# Patient Record
Sex: Female | Born: 1985 | Race: Black or African American | Hispanic: Yes | Marital: Single | Smoking: Current every day smoker
Health system: Southern US, Community
[De-identification: ages and names within clinical notes are randomized; demographics above are authoritative.]

## PROBLEM LIST (undated history)

## (undated) ENCOUNTER — Inpatient Hospital Stay (HOSPITAL_COMMUNITY): Payer: Self-pay

## (undated) DIAGNOSIS — F32A Depression, unspecified: Secondary | ICD-10-CM

## (undated) DIAGNOSIS — F329 Major depressive disorder, single episode, unspecified: Secondary | ICD-10-CM

## (undated) DIAGNOSIS — K219 Gastro-esophageal reflux disease without esophagitis: Secondary | ICD-10-CM

## (undated) DIAGNOSIS — O24419 Gestational diabetes mellitus in pregnancy, unspecified control: Secondary | ICD-10-CM

## (undated) DIAGNOSIS — O2341 Unspecified infection of urinary tract in pregnancy, first trimester: Secondary | ICD-10-CM

## (undated) DIAGNOSIS — E049 Nontoxic goiter, unspecified: Secondary | ICD-10-CM

## (undated) DIAGNOSIS — F319 Bipolar disorder, unspecified: Secondary | ICD-10-CM

## (undated) DIAGNOSIS — IMO0002 Reserved for concepts with insufficient information to code with codable children: Secondary | ICD-10-CM

## (undated) HISTORY — DX: Depression, unspecified: F32.A

## (undated) HISTORY — PX: WISDOM TOOTH EXTRACTION: SHX21

## (undated) HISTORY — DX: Major depressive disorder, single episode, unspecified: F32.9

## (undated) HISTORY — DX: Reserved for concepts with insufficient information to code with codable children: IMO0002

---
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
## 2007-07-08 ENCOUNTER — Emergency Department (HOSPITAL_COMMUNITY): Admission: EM | Admit: 2007-07-08 | Discharge: 2007-07-08 | Payer: Self-pay | Admitting: *Deleted

## 2008-06-22 ENCOUNTER — Emergency Department (HOSPITAL_COMMUNITY): Admission: EM | Admit: 2008-06-22 | Discharge: 2008-06-22 | Payer: Self-pay | Admitting: Emergency Medicine

## 2008-06-24 ENCOUNTER — Inpatient Hospital Stay (HOSPITAL_COMMUNITY): Admission: AD | Admit: 2008-06-24 | Discharge: 2008-06-25 | Payer: Self-pay | Admitting: Obstetrics & Gynecology

## 2008-06-27 ENCOUNTER — Ambulatory Visit: Payer: Self-pay | Admitting: Physician Assistant

## 2008-06-27 ENCOUNTER — Inpatient Hospital Stay (HOSPITAL_COMMUNITY): Admission: AD | Admit: 2008-06-27 | Discharge: 2008-06-27 | Payer: Self-pay | Admitting: Obstetrics & Gynecology

## 2008-07-02 ENCOUNTER — Inpatient Hospital Stay (HOSPITAL_COMMUNITY): Admission: AD | Admit: 2008-07-02 | Discharge: 2008-07-02 | Payer: Self-pay | Admitting: Obstetrics & Gynecology

## 2009-07-08 ENCOUNTER — Inpatient Hospital Stay (HOSPITAL_COMMUNITY): Admission: AD | Admit: 2009-07-08 | Discharge: 2009-07-08 | Payer: Self-pay | Admitting: Family Medicine

## 2009-09-24 IMAGING — US US OB COMP LESS 14 WK
1 series · 13 of 28 positions shown · non-contrast
Comparison: none

CLINICAL DATA: Early pregnancy, vaginal bleeding

OBSTETRIC <14 WK US AND TRANSVAGINAL OB US
TECHNIQUE: Both transabdominal and transvaginal ultrasound
examinations were performed for complete evaluation of the
gestation as well as the maternal uterus, adnexal regions, and
pelvic cul-de-sac.

[Series 1: us ob comp less 14 wks · 13 of 66 slices shown]
[im 3/66]
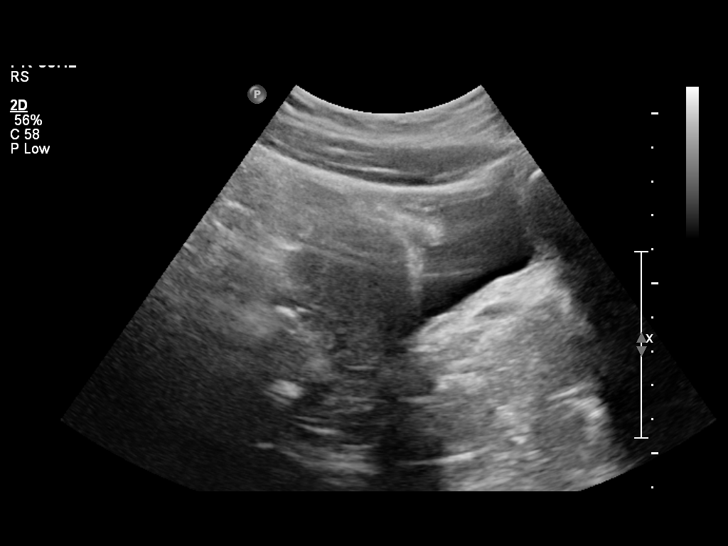
[im 8/66]
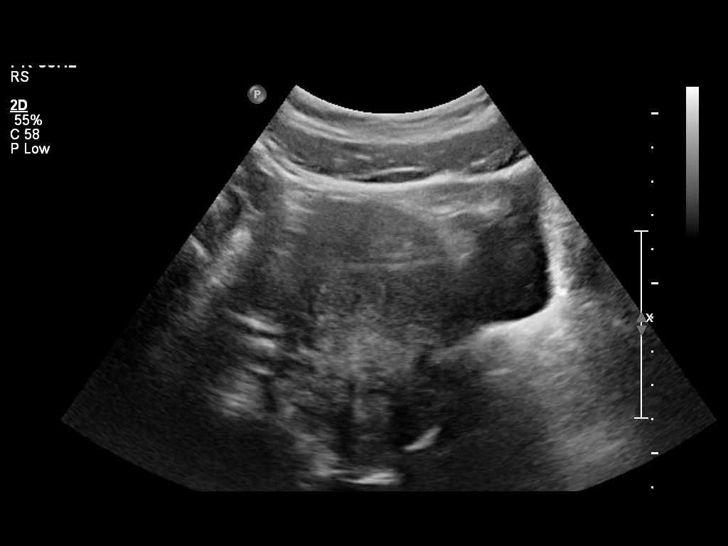
[im 13/66]
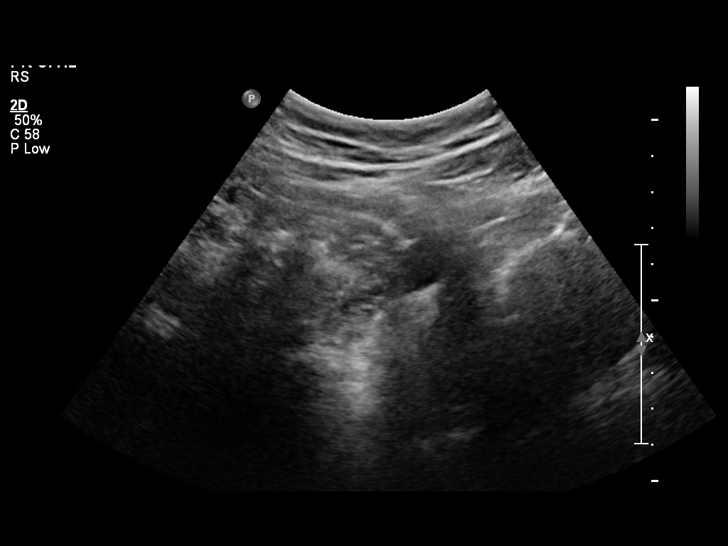
[im 17/66]
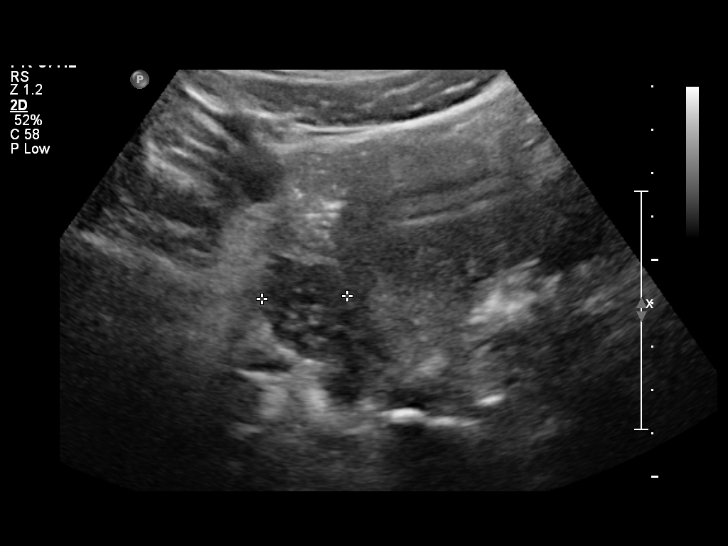
[im 22/66]
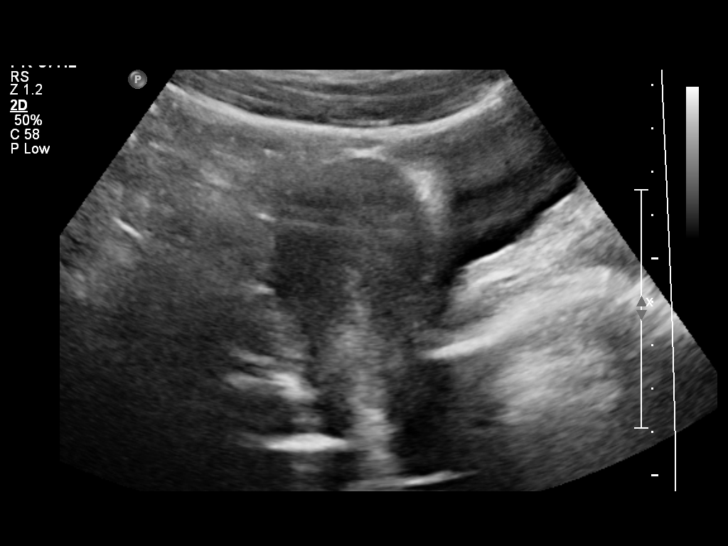
[im 27/66]
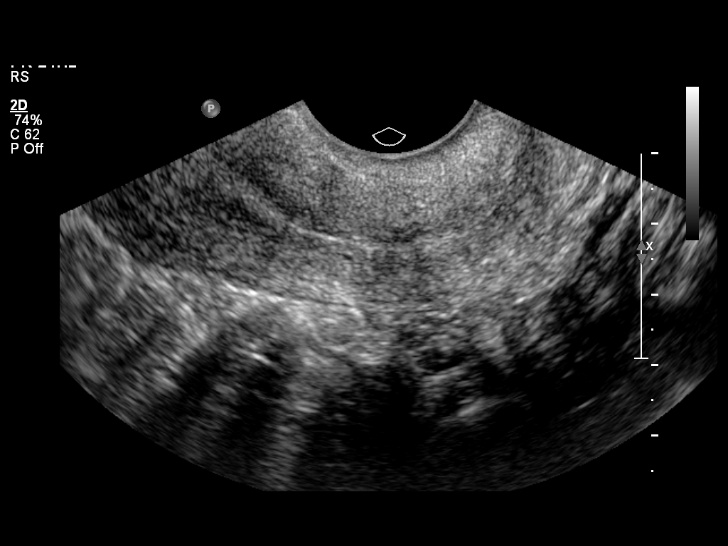
[im 34/66]
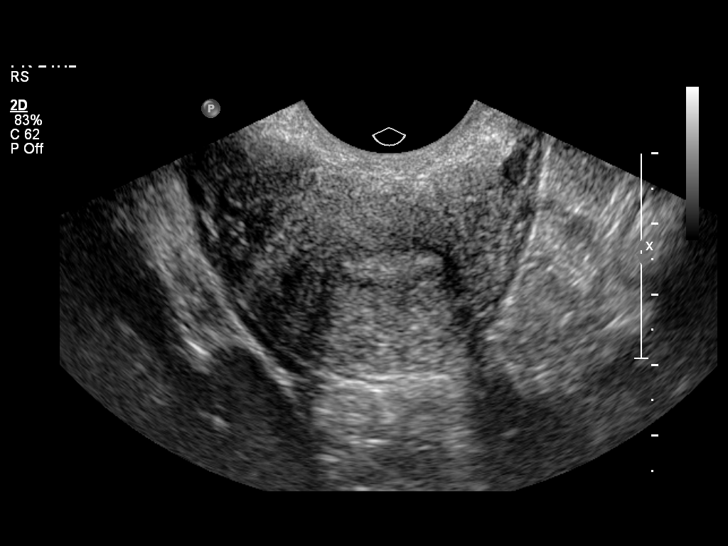
[im 39/66]
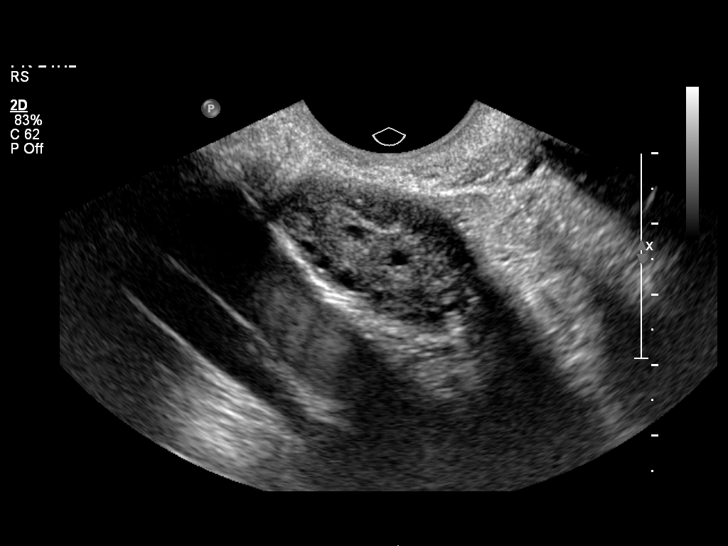
[im 44/66]
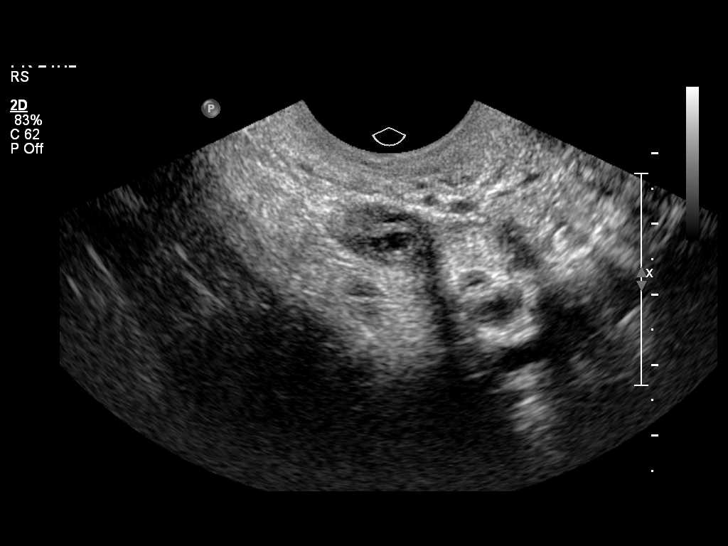
[im 49/66]
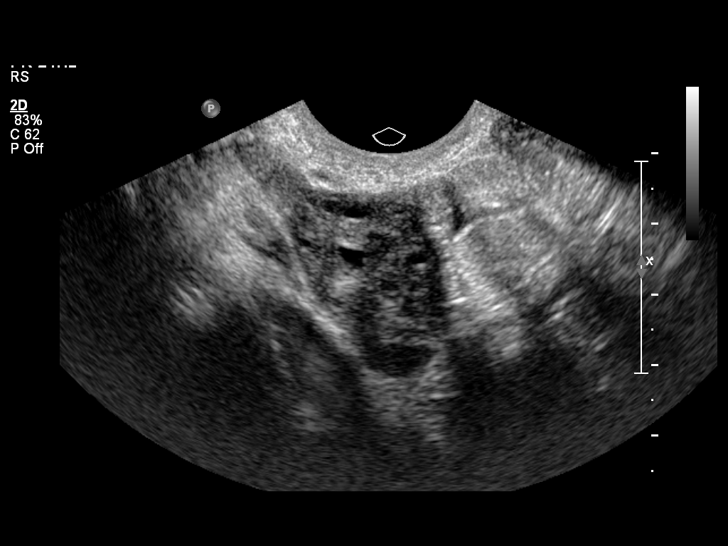
[im 53/66]
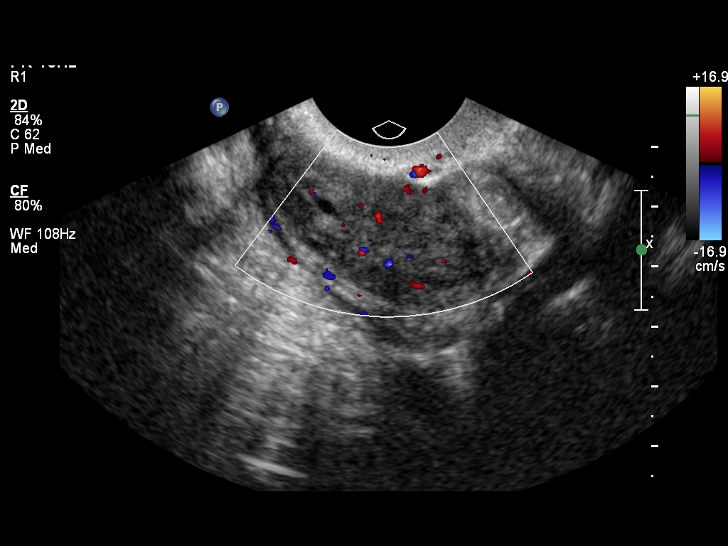
[im 58/66]
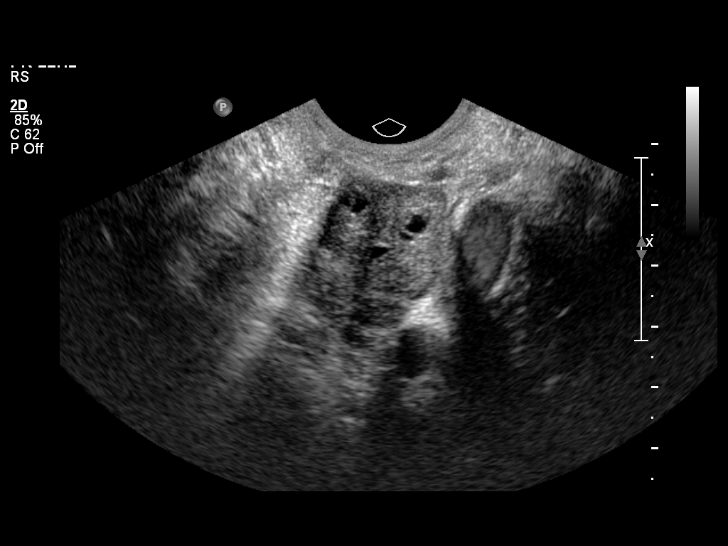
[im 63/66]
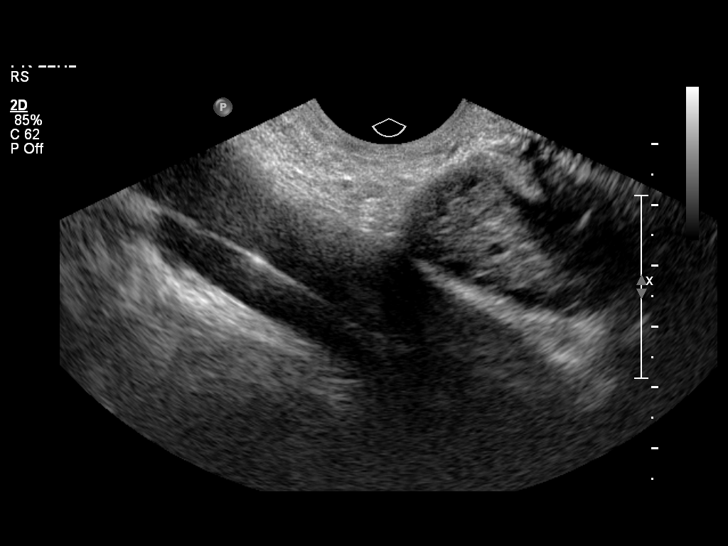

[13 of 28 positions shown; findings below may reference images not displayed]

No prior exams for comparison.
No serum quantitative beta HCG level for correlation

Intrauterine gestational sac: Not identified
Yolk sac: Not identified
Embryo: Not identified
Cardiac Activity: Not identified
Heart Rate: N/A bpm

Maternal uterus/adnexae:
Uterus normal in size morphology.
No gestational sac, intrauterine pregnancy, or endometrial fluid
collection seen.
Few tiny Nabothian cysts in cervix.
Right ovary normal size and morphology, 4.1 x 2.0 x 2.1 cm.
Left ovary normal size and morphology, 4.1 x 2.0 x 2.2 cm.
No ovarian or adnexal mass.
No free pelvic fluid.
IMPRESSION: Normal appearing uterus and ovaries.
No evidence of an intrauterine nor extrauterine pregnancy
identified.
In the absence of visualized intrauterine or extrauterine pregnancy
and lacking a quantitative beta HCG level, differential diagnosis
remains early intrauterine pregnancy too early to visualize,
ectopic pregnancy, and spontaneous abortion.
Correlation with quantitative beta HCG level and either serial
quantitative beta HCG and or follow-up ultrasound recommended to
exclude ectopic pregnancy.

## 2009-12-07 ENCOUNTER — Inpatient Hospital Stay (HOSPITAL_COMMUNITY): Admission: AD | Admit: 2009-12-07 | Discharge: 2009-12-07 | Payer: Self-pay | Admitting: Obstetrics and Gynecology

## 2009-12-31 ENCOUNTER — Ambulatory Visit: Payer: Self-pay | Admitting: Nurse Practitioner

## 2009-12-31 ENCOUNTER — Inpatient Hospital Stay (HOSPITAL_COMMUNITY): Admission: AD | Admit: 2009-12-31 | Discharge: 2009-12-31 | Payer: Self-pay | Admitting: Obstetrics and Gynecology

## 2010-01-23 ENCOUNTER — Inpatient Hospital Stay (HOSPITAL_COMMUNITY): Admission: AD | Admit: 2010-01-23 | Discharge: 2010-01-23 | Payer: Self-pay | Admitting: Obstetrics and Gynecology

## 2010-02-02 ENCOUNTER — Ambulatory Visit: Payer: Self-pay | Admitting: Family

## 2010-02-02 ENCOUNTER — Inpatient Hospital Stay (HOSPITAL_COMMUNITY): Admission: AD | Admit: 2010-02-02 | Discharge: 2010-02-02 | Payer: Self-pay | Admitting: Obstetrics and Gynecology

## 2010-02-03 ENCOUNTER — Inpatient Hospital Stay (HOSPITAL_COMMUNITY): Admission: AD | Admit: 2010-02-03 | Discharge: 2010-02-05 | Payer: Self-pay | Admitting: Obstetrics and Gynecology

## 2010-09-07 LAB — CBC
HCT: 32.8 % — ABNORMAL LOW (ref 36.0–46.0)
HCT: 37.9 % (ref 36.0–46.0)
Hemoglobin: 11.1 g/dL — ABNORMAL LOW (ref 12.0–15.0)
Hemoglobin: 12.7 g/dL (ref 12.0–15.0)
MCH: 32 pg (ref 26.0–34.0)
MCHC: 33.5 g/dL (ref 30.0–36.0)
MCHC: 33.7 g/dL (ref 30.0–36.0)
MCV: 95.3 fL (ref 78.0–100.0)
MCV: 95.8 fL (ref 78.0–100.0)
RBC: 3.98 MIL/uL (ref 3.87–5.11)
RDW: 13.6 % (ref 11.5–15.5)

## 2010-09-07 LAB — RAPID URINE DRUG SCREEN, HOSP PERFORMED
Barbiturates: NOT DETECTED
Opiates: NOT DETECTED

## 2010-09-07 LAB — RPR: RPR Ser Ql: NONREACTIVE

## 2010-09-09 LAB — URINE MICROSCOPIC-ADD ON

## 2010-09-09 LAB — RAPID URINE DRUG SCREEN, HOSP PERFORMED
Amphetamines: NOT DETECTED
Benzodiazepines: NOT DETECTED
Cocaine: NOT DETECTED
Opiates: NOT DETECTED
Tetrahydrocannabinol: POSITIVE — AB

## 2010-09-09 LAB — URINALYSIS, ROUTINE W REFLEX MICROSCOPIC
Bilirubin Urine: NEGATIVE
Bilirubin Urine: NEGATIVE
Bilirubin Urine: NEGATIVE
Glucose, UA: NEGATIVE mg/dL
Glucose, UA: NEGATIVE mg/dL
Hgb urine dipstick: NEGATIVE
Ketones, ur: NEGATIVE mg/dL
Nitrite: NEGATIVE
Protein, ur: 100 mg/dL — AB
Protein, ur: NEGATIVE mg/dL
Specific Gravity, Urine: 1.02 (ref 1.005–1.030)
Urobilinogen, UA: 0.2 mg/dL (ref 0.0–1.0)
Urobilinogen, UA: 0.2 mg/dL (ref 0.0–1.0)
pH: 7 (ref 5.0–8.0)

## 2010-09-09 LAB — URINE CULTURE: Colony Count: >=100000

## 2010-10-08 LAB — CREATININE, SERUM: GFR calc non Af Amer: >60 mL/min (ref >60)

## 2010-10-08 LAB — DIFFERENTIAL
Eosinophils Absolute: 0.2 10*3/uL (ref 0.0–0.7)
Eosinophils Relative: 1 % (ref 0–5)
Lymphs Abs: 3.5 10*3/uL (ref 0.7–4.0)
Monocytes Absolute: 0.8 10*3/uL (ref 0.1–1.0)
Monocytes Relative: 6 % (ref 3–12)

## 2010-10-08 LAB — CBC
HCT: 41.2 % (ref 36.0–46.0)
Hemoglobin: 13.2 g/dL (ref 12.0–15.0)
MCV: 95.2 fL (ref 78.0–100.0)
MCV: 96.3 fL (ref 78.0–100.0)
Platelets: 240 10*3/uL (ref 150–400)
RBC: 4.13 MIL/uL (ref 3.87–5.11)
RDW: 13.6 % (ref 11.5–15.5)
WBC: 15.8 10*3/uL — ABNORMAL HIGH (ref 4.0–10.5)

## 2010-10-08 LAB — WET PREP, GENITAL: Yeast Wet Prep HPF POC: NONE SEEN

## 2010-10-08 LAB — HCG, QUANTITATIVE, PREGNANCY: hCG, Beta Chain, Quant, S: 326 m[IU]/mL — ABNORMAL HIGH (ref <5)

## 2010-10-08 LAB — GC/CHLAMYDIA PROBE AMP, GENITAL: Chlamydia, DNA Probe: NEGATIVE

## 2010-10-08 LAB — BUN: BUN: 4 mg/dL — ABNORMAL LOW (ref 6–23)

## 2011-03-29 LAB — URINALYSIS, ROUTINE W REFLEX MICROSCOPIC
Glucose, UA: NEGATIVE mg/dL
Specific Gravity, Urine: 1.017 (ref 1.005–1.030)

## 2011-03-29 LAB — WET PREP, GENITAL: Yeast Wet Prep HPF POC: NONE SEEN

## 2011-03-29 LAB — URINE CULTURE

## 2011-03-29 LAB — URINE MICROSCOPIC-ADD ON

## 2011-03-29 LAB — GC/CHLAMYDIA PROBE AMP, GENITAL
Chlamydia, DNA Probe: POSITIVE — AB
GC Probe Amp, Genital: NEGATIVE

## 2011-04-10 LAB — BASIC METABOLIC PANEL
CO2: 24
Chloride: 104
Creatinine, Ser: 0.79
GFR calc Af Amer: >60
Potassium: 3.9
Sodium: 135

## 2011-04-10 LAB — CBC
HCT: 41
Hemoglobin: 14
MCHC: 34.2
MCV: 90.2
Platelets: 217
RBC: 4.55
RDW: 12.5
WBC: 9.9

## 2011-04-10 LAB — URINALYSIS, ROUTINE W REFLEX MICROSCOPIC
Bilirubin Urine: NEGATIVE
Glucose, UA: NEGATIVE
Hgb urine dipstick: NEGATIVE
Specific Gravity, Urine: 1.025
Urobilinogen, UA: 1

## 2011-04-10 LAB — COMPREHENSIVE METABOLIC PANEL
AST: 22
BUN: 10
CO2: 23
Calcium: 8.8
Chloride: 103
Creatinine, Ser: 0.81
GFR calc Af Amer: >60
GFR calc non Af Amer: >60
Glucose, Bld: 91
Total Bilirubin: 0.7

## 2011-04-10 LAB — DIFFERENTIAL
Basophils Absolute: 0
Lymphocytes Relative: 10 — ABNORMAL LOW
Lymphs Abs: 1 — ABNORMAL LOW
Neutro Abs: 8.3 — ABNORMAL HIGH
Neutrophils Relative %: 84 — ABNORMAL HIGH

## 2011-04-10 LAB — PREGNANCY, URINE: Preg Test, Ur: NEGATIVE

## 2011-06-25 NOTE — L&D Delivery Note (Signed)
Delivery Note AROM with vtx at 0 to +1, clear fluid.  Her IV came out, so she was unable to get IV antibiotics for GBS, but baby delivered quickly anyway.  At 12:17 AM a viable female was delivered via Vaginal, Spontaneous Delivery (Presentation: Left Occiput Anterior).  APGAR: 8, 9; weight 8 lb 3 oz (3714 g).   Placenta status: Intact, Spontaneous.  Cord: 3 vessels with the following complications: None.  Anesthesia: None  Episiotomy: None Lacerations: None, bilateral labial abrasions not repaired Suture Repair: none Est. Blood Loss (mL): 350  Mom to postpartum.  Baby to stay with mom.  Shawntina Diffee D 02/12/2012, 12:50 AM

## 2011-12-31 ENCOUNTER — Encounter (HOSPITAL_COMMUNITY): Payer: Self-pay

## 2011-12-31 ENCOUNTER — Inpatient Hospital Stay (HOSPITAL_COMMUNITY)
Admission: AD | Admit: 2011-12-31 | Discharge: 2011-12-31 | Disposition: A | Discharge status: Home / Self Care | Payer: Medicaid Other | Source: Ambulatory Visit | Attending: Obstetrics and Gynecology | Admitting: Obstetrics and Gynecology

## 2011-12-31 DIAGNOSIS — R109 Unspecified abdominal pain: Secondary | ICD-10-CM | POA: Insufficient documentation

## 2011-12-31 DIAGNOSIS — O4700 False labor before 37 completed weeks of gestation, unspecified trimester: Secondary | ICD-10-CM | POA: Insufficient documentation

## 2011-12-31 DIAGNOSIS — O479 False labor, unspecified: Secondary | ICD-10-CM

## 2011-12-31 HISTORY — DX: Gastro-esophageal reflux disease without esophagitis: K21.9

## 2011-12-31 HISTORY — DX: Gestational diabetes mellitus in pregnancy, unspecified control: O24.419

## 2011-12-31 HISTORY — DX: Unspecified infection of urinary tract in pregnancy, first trimester: O23.41

## 2011-12-31 HISTORY — DX: Nontoxic goiter, unspecified: E04.9

## 2011-12-31 HISTORY — DX: Bipolar disorder, unspecified: F31.9

## 2011-12-31 LAB — URINALYSIS, ROUTINE W REFLEX MICROSCOPIC
Glucose, UA: NEGATIVE mg/dL
Hgb urine dipstick: NEGATIVE
Leukocytes, UA: NEGATIVE
Protein, ur: NEGATIVE mg/dL
Specific Gravity, Urine: 1.02 (ref 1.005–1.030)
Urobilinogen, UA: 1 mg/dL (ref 0.0–1.0)

## 2011-12-31 LAB — WET PREP, GENITAL
Clue Cells Wet Prep HPF POC: NONE SEEN
Yeast Wet Prep HPF POC: NONE SEEN

## 2011-12-31 LAB — POCT FERN TEST: Fern Test: NEGATIVE

## 2011-12-31 NOTE — MAU Note (Signed)
Pains started yesterday morning.  Lower abd, back and butt.  Every , getting stronger and closer. Mucous d/c noted when went to the bathroom.  Denies hx of PTL- [place]

## 2011-12-31 NOTE — MAU Note (Signed)
Pt says she has had clear, bad smelling, watery, liquid that dampens the crotch of her underware since last night.  Low abd cramping since yesterday.  Last intercourse was two days ago.

## 2011-12-31 NOTE — MAU Provider Note (Signed)
History     CSN: [id number]  Arrival date and time: 12/31/11 1412   First Provider Initiated Contact with Patient 12/31/11 1526      Chief Complaint  Patient presents with  . Labor Eval   HPI Brenda Porter is 26 y.o. G3P1011 [redacted]w[redacted]d weeks presenting with abdominal pain that began yesterday and ? Loosing her mucus plug today.  Denies vaginal bleeding.  States the pain occurs every 10 minutes.  Had vaginal exam 3 weeks ago and told she was closed.  + fetal movement.  States vaginal discharge is white with odor.  Last intercourse 2 days ago. 1 steady partner, not sure he is faithful.     Past Medical History  Diagnosis Date  . Goiter   . Gestational diabetes   . Bipolar 1 disorder, depressed   . GERD (gastroesophageal reflux disease)   . UTI (urinary tract infection) in pregnancy in first trimester     Past Surgical History  Procedure Date  . Wisdom tooth extraction     Family History  Problem Relation Age of Onset  . Hyperthyroidism Mother   . Hypertension Father   . Diabetes Father   . Blindness Father   . Hyperthyroidism Sister   . Cancer Maternal Grandmother   . Hyperthyroidism Maternal Grandmother     History  Substance Use Topics  . Smoking status: Current Everyday Smoker -- 0.5 packs/day for 10 years    Types: Cigarettes  . Smokeless tobacco: Not on file  . Alcohol Use: No    Allergies:  Allergies  Allergen Reactions  . Codeine Nausea And Vomiting, Swelling and Rash  . Latex Itching and Rash  . Oxycodone Swelling and Rash    Hydrocodone OK    Prescriptions prior to admission  Medication Sig Dispense Refill  . calcium carbonate (TUMS - DOSED IN MG ELEMENTAL CALCIUM) 500 MG chewable tablet Chew 2 tablets by mouth 3 (three) times daily as needed. For indigestion        Review of Systems  Gastrointestinal: Positive for abdominal pain. Negative for nausea, vomiting and diarrhea (3 days ago).  Genitourinary:       Neg for vaginal bleeding.  + for  vaginal discharge ? Mucous plug   Physical Exam   Blood pressure 116/60, pulse 87, temperature 98.6 F (37 C), temperature source Oral, resp. rate 20, height 5\' 4"  (1.626 m), weight 100.245 kg (221 lb).  Physical Exam  Constitutional: She is oriented to person, place, and time. She appears well-developed and well-nourished. No distress.  HENT:  Head: Normocephalic.  Neck: Normal range of motion.  Cardiovascular: Normal rate.   Respiratory: Effort normal.  GI: Soft.  Genitourinary: Uterus is enlarged (gravid). Cervix exhibits no discharge. No bleeding around the vagina. Vaginal discharge (small amount of white discharge) found.       Cervix- examined by [facility], RN-closed, posterior  Neurological: She is alert and oriented to person, place, and time.  Skin: Skin is warm and dry.  Psychiatric: She has a normal mood and affect. Her behavior is normal.   Results for orders placed during the hospital encounter of 12/31/11 (from the past 24 hour(s))  URINALYSIS, ROUTINE W REFLEX MICROSCOPIC     Status: Abnormal   Collection Time   12/31/11  2:35 PM      Component Value Range   Color, Urine YELLOW  YELLOW   APPearance CLOUDY (*) CLEAR   Specific Gravity, Urine 1.020  1.005 - 1.030   pH 6.5  5.0 -  8.0   Glucose, UA NEGATIVE  NEGATIVE mg/dL   Hgb urine dipstick NEGATIVE  NEGATIVE   Bilirubin Urine NEGATIVE  NEGATIVE   Ketones, ur NEGATIVE  NEGATIVE mg/dL   Protein, ur NEGATIVE  NEGATIVE mg/dL   Urobilinogen, UA 1.0  0.0 - 1.0 mg/dL   Nitrite NEGATIVE  NEGATIVE   Leukocytes, UA NEGATIVE  NEGATIVE  POCT FERN TEST     Status: Normal   Collection Time   12/31/11  3:09 PM      Component Value Range   Fern Test Negative    WET PREP, GENITAL     Status: Abnormal   Collection Time   12/31/11  4:05 PM      Component Value Range   Yeast Wet Prep HPF POC NONE SEEN  NONE SEEN   Trich, Wet Prep NONE SEEN  NONE SEEN   Clue Cells Wet Prep HPF POC NONE SEEN  NONE SEEN   WBC, Wet Prep HPF POC FEW (*)  NONE SEEN    MAU Course  Procedures  GC/CHL pending NST baseline 140, reactive with 15X15.  Mild irritability at the beginning of the strip that resolved.   MDM 16:44 Called Dr. Senaida Ores to report labs and physical exam findings.  She is in surgery and will call when she is out.   16:50  Patient called out and is ready for discharge.  The patient thinks she is a "little" further along than the office has dated her and she wanted to make sure she wasn't in labor.  I will discharge her to home with instruction to call her doctor for increased pain, ? Leaking of fluid, or decreased fetal movement.  Assessment and Plan  A;  Braxton-Hicks Contractions at [redacted]w[redacted]d      Neg fern, Reactive NST, cervix is closed P:  Keep scheduled appt in the office     Call for worsening sxs  [name],EVE M 12/31/2011, 3:28 PM

## 2012-01-27 ENCOUNTER — Encounter (HOSPITAL_COMMUNITY): Payer: Self-pay

## 2012-01-27 ENCOUNTER — Inpatient Hospital Stay (HOSPITAL_COMMUNITY)
Admission: AD | Admit: 2012-01-27 | Discharge: 2012-01-27 | Disposition: A | Discharge status: Home / Self Care | Payer: Medicaid Other | Source: Ambulatory Visit | Attending: Obstetrics and Gynecology | Admitting: Obstetrics and Gynecology

## 2012-01-27 DIAGNOSIS — O479 False labor, unspecified: Secondary | ICD-10-CM | POA: Insufficient documentation

## 2012-01-27 LAB — URINALYSIS, ROUTINE W REFLEX MICROSCOPIC
Glucose, UA: NEGATIVE mg/dL
Hgb urine dipstick: NEGATIVE
pH: 6.5 (ref 5.0–8.0)

## 2012-01-27 LAB — URINE MICROSCOPIC-ADD ON

## 2012-01-27 NOTE — MAU Note (Signed)
Dr. Ambrose Mantle notified pt in MAU for labor eval, irregualr ctx's, cervix unchanged, membranes intact, gbs negative. Order for pt to walk x1 hour, then recheck cervix and cb with results.

## 2012-01-27 NOTE — MAU Note (Signed)
Pt states ctx's regular since 1500 this afternoon. Denies bleeding or lof. Was 1.5cm in office

## 2012-01-27 NOTE — MAU Note (Signed)
Patient states she is having frequent contractions. Denies any bleeding or leaking and reports fetal movement, just less than usual.

## 2012-02-11 ENCOUNTER — Inpatient Hospital Stay (HOSPITAL_COMMUNITY)
Admission: AD | Admit: 2012-02-11 | Discharge: 2012-02-14 | DRG: 775 | Disposition: A | Discharge status: Home / Self Care | Payer: Medicaid Other | Source: Ambulatory Visit | Attending: Obstetrics and Gynecology | Admitting: Obstetrics and Gynecology

## 2012-02-11 DIAGNOSIS — IMO0002 Reserved for concepts with insufficient information to code with codable children: Secondary | ICD-10-CM | POA: Diagnosis not present

## 2012-02-11 DIAGNOSIS — Z22330 Carrier of Group B streptococcus: Secondary | ICD-10-CM

## 2012-02-11 DIAGNOSIS — O99892 Other specified diseases and conditions complicating childbirth: Secondary | ICD-10-CM | POA: Diagnosis present

## 2012-02-11 DIAGNOSIS — IMO0001 Reserved for inherently not codable concepts without codable children: Secondary | ICD-10-CM

## 2012-02-12 ENCOUNTER — Encounter (HOSPITAL_COMMUNITY): Payer: Self-pay | Admitting: *Deleted

## 2012-02-12 DIAGNOSIS — IMO0001 Reserved for inherently not codable concepts without codable children: Secondary | ICD-10-CM

## 2012-02-12 DIAGNOSIS — O24419 Gestational diabetes mellitus in pregnancy, unspecified control: Secondary | ICD-10-CM

## 2012-02-12 DIAGNOSIS — IMO0002 Reserved for concepts with insufficient information to code with codable children: Secondary | ICD-10-CM

## 2012-02-12 HISTORY — DX: Reserved for concepts with insufficient information to code with codable children: IMO0002

## 2012-02-12 LAB — OB RESULTS CONSOLE GC/CHLAMYDIA
Chlamydia: NEGATIVE
Gonorrhea: NEGATIVE

## 2012-02-12 LAB — OB RESULTS CONSOLE GBS: GBS: POSITIVE

## 2012-02-12 LAB — CBC
Platelets: 152 10*3/uL (ref 150–400)
RBC: 3.75 MIL/uL — ABNORMAL LOW (ref 3.87–5.11)
RDW: 13.7 % (ref 11.5–15.5)
WBC: 17 10*3/uL — ABNORMAL HIGH (ref 4.0–10.5)

## 2012-02-12 LAB — RPR: RPR Ser Ql: NONREACTIVE

## 2012-02-12 MED ORDER — WITCH HAZEL-GLYCERIN EX PADS: 1.0000 "application " | MEDICATED_PAD | CUTANEOUS | Status: DC | PRN

## 2012-02-12 MED ORDER — DIPHENHYDRAMINE HCL 25 MG PO CAPS: 25.0000 mg | ORAL_CAPSULE | Freq: Four times a day (QID) | ORAL | Status: DC | PRN

## 2012-02-12 MED ORDER — OXYTOCIN 10 UNIT/ML IJ SOLN
10.0000 [IU] | Freq: Once | INTRAMUSCULAR | Status: AC
  Administered 2012-02-12: 10 [IU] via INTRAMUSCULAR

## 2012-02-12 MED ORDER — OXYTOCIN 40 UNITS IN LACTATED RINGERS INFUSION - SIMPLE MED: 62.5000 mL/h | Freq: Once | INTRAVENOUS | Status: DC

## 2012-02-12 MED ORDER — METHYLERGONOVINE MALEATE 0.2 MG/ML IJ SOLN: 0.2000 mg | INTRAMUSCULAR | Status: DC | PRN

## 2012-02-12 MED ORDER — IBUPROFEN 600 MG PO TABS
600.0000 mg | ORAL_TABLET | Freq: Four times a day (QID) | ORAL | Status: DC
  Administered 2012-02-12 – 2012-02-14 (×10): 600 mg via ORAL
  Filled 2012-02-12 (×10): qty 1

## 2012-02-12 MED ORDER — CITRIC ACID-SODIUM CITRATE 334-500 MG/5ML PO SOLN: 30.0000 mL | ORAL | Status: DC | PRN

## 2012-02-12 MED ORDER — LACTATED RINGERS IV SOLN: 500.0000 mL | INTRAVENOUS | Status: DC | PRN

## 2012-02-12 MED ORDER — MAGNESIUM HYDROXIDE 400 MG/5ML PO SUSP: 30.0000 mL | ORAL | Status: DC | PRN

## 2012-02-12 MED ORDER — ONDANSETRON HCL 4 MG PO TABS: 4.0000 mg | ORAL_TABLET | ORAL | Status: DC | PRN

## 2012-02-12 MED ORDER — ONDANSETRON HCL 4 MG/2ML IJ SOLN: 4.0000 mg | INTRAMUSCULAR | Status: DC | PRN

## 2012-02-12 MED ORDER — HYDROCODONE-ACETAMINOPHEN 5-325 MG PO TABS
1.0000 | ORAL_TABLET | ORAL | Status: DC | PRN
Start: 2012-02-12 — End: 2012-02-14
  Administered 2012-02-12 – 2012-02-14 (×6): 1 via ORAL
  Filled 2012-02-12 (×6): qty 1

## 2012-02-12 MED ORDER — IBUPROFEN 600 MG PO TABS
600.0000 mg | ORAL_TABLET | Freq: Four times a day (QID) | ORAL | Status: DC | PRN
  Administered 2012-02-12: 600 mg via ORAL
  Filled 2012-02-12: qty 1

## 2012-02-12 MED ORDER — LIDOCAINE HCL (PF) 1 % IJ SOLN: 30.0000 mL | INTRAMUSCULAR | Status: DC | PRN

## 2012-02-12 MED ORDER — ONDANSETRON HCL 4 MG/2ML IJ SOLN: 4.0000 mg | Freq: Four times a day (QID) | INTRAMUSCULAR | Status: DC | PRN

## 2012-02-12 MED ORDER — BENZOCAINE-MENTHOL 20-0.5 % EX AERO
1.0000 "application " | INHALATION_SPRAY | CUTANEOUS | Status: DC | PRN
  Administered 2012-02-13: 1 via TOPICAL
  Filled 2012-02-12: qty 56

## 2012-02-12 MED ORDER — ACETAMINOPHEN 325 MG PO TABS: 650.0000 mg | ORAL_TABLET | ORAL | Status: DC | PRN

## 2012-02-12 MED ORDER — SENNOSIDES-DOCUSATE SODIUM 8.6-50 MG PO TABS
2.0000 | ORAL_TABLET | Freq: Every day | ORAL | Status: DC
  Administered 2012-02-12 – 2012-02-13 (×2): 2 via ORAL

## 2012-02-12 MED ORDER — FLEET ENEMA 7-19 GM/118ML RE ENEM: 1.0000 | ENEMA | RECTAL | Status: DC | PRN

## 2012-02-12 MED ORDER — OXYTOCIN 40 UNITS IN LACTATED RINGERS INFUSION - SIMPLE MED
INTRAVENOUS | Status: AC
  Filled 2012-02-12: qty 1000

## 2012-02-12 MED ORDER — TETANUS-DIPHTH-ACELL PERTUSSIS 5-2.5-18.5 LF-MCG/0.5 IM SUSP: 0.5000 mL | Freq: Once | INTRAMUSCULAR | Status: DC

## 2012-02-12 MED ORDER — OXYTOCIN BOLUS FROM INFUSION
250.0000 mL | Freq: Once | INTRAVENOUS | Status: DC
  Filled 2012-02-12: qty 500

## 2012-02-12 MED ORDER — LANOLIN HYDROUS EX OINT: TOPICAL_OINTMENT | CUTANEOUS | Status: DC | PRN

## 2012-02-12 MED ORDER — PRENATAL MULTIVITAMIN CH
1.0000 | ORAL_TABLET | Freq: Every day | ORAL | Status: DC
  Administered 2012-02-12 – 2012-02-14 (×3): 1 via ORAL
  Filled 2012-02-12 (×3): qty 1

## 2012-02-12 MED ORDER — MEASLES, MUMPS & RUBELLA VAC ~~LOC~~ INJ
0.5000 mL | INJECTION | Freq: Once | SUBCUTANEOUS | Status: DC
  Filled 2012-02-12: qty 0.5

## 2012-02-12 MED ORDER — METHYLERGONOVINE MALEATE 0.2 MG PO TABS: 0.2000 mg | ORAL_TABLET | ORAL | Status: DC | PRN

## 2012-02-12 MED ORDER — SIMETHICONE 80 MG PO CHEW: 80.0000 mg | CHEWABLE_TABLET | ORAL | Status: DC | PRN

## 2012-02-12 MED ORDER — OXYTOCIN 10 UNIT/ML IJ SOLN
INTRAMUSCULAR | Status: AC
  Filled 2012-02-12: qty 2

## 2012-02-12 MED ORDER — LACTATED RINGERS IV SOLN: INTRAVENOUS | Status: DC

## 2012-02-12 MED ORDER — ZOLPIDEM TARTRATE 5 MG PO TABS: 5.0000 mg | ORAL_TABLET | Freq: Every evening | ORAL | Status: DC | PRN

## 2012-02-12 MED ORDER — DIBUCAINE 1 % RE OINT: 1.0000 "application " | TOPICAL_OINTMENT | RECTAL | Status: DC | PRN

## 2012-02-12 NOTE — Progress Notes (Signed)
UR chart review completed.  

## 2012-02-12 NOTE — Progress Notes (Signed)
Patient ID: Brenda Porter, female   DOB: [birth year], 26 y.o.   MRN: [id number] Pt c/o some right thigh weakness and cramping with earlier ambulation Had no epidural and fast delivery D/w her prob muscle spasm and advised to walk with help this pm to decrease any risk of falls

## 2012-02-12 NOTE — Progress Notes (Signed)
PPD #0 No problems Afeb, VSS Fundus firm, NT at U-1 Continue routine postpartum care 

## 2012-02-12 NOTE — Progress Notes (Signed)
Here via EMS, SVE done, Rapid response OB here, will transport patient to room 167 stat.

## 2012-02-12 NOTE — H&P (Signed)
Brenda Porter is a 26 y.o. female, G3 P1011, EGA 39+ weeks with EDC 8-24 by 24 week u/s presenting for reg ctx.  Pts mom called and said pt started leaking fluid at 1900, then had reg ctx which became severe.  She got EMS to bring pt to hospital.  In MAU, VE complete, taken to L&D.  She had an IV, but pulled it out when on L&D.  Prenatal care complicated by late care at about 24 weeks, sporadic care, but not very compliant with visits.  GBS+ in urine, treated with PO Keflex.  See prenatal records for complete history.  Maternal Medical History:  Reason for admission: Reason for admission: contractions.  Contractions: Frequency: regular.   Perceived severity is strong.    Fetal activity: Perceived fetal activity is normal.    Prenatal complications: Late ans limited care    OB History    Grav Para Term Preterm Abortions TAB SAB Ect Mult Living   3 1 1  1   1  1     SVD at 39 weeks, 6 lbs 8 oz, no comps Right ectopic treated with MTX  Past Medical History  Diagnosis Date  . Goiter   . Gestational diabetes   . Bipolar 1 disorder, depressed   . GERD (gastroesophageal reflux disease)   . UTI (urinary tract infection) in pregnancy in first trimester    Past Surgical History  Procedure Date  . Wisdom tooth extraction    Family History: family history includes Blindness in her father; Cancer in her maternal grandmother; Diabetes in her father; Hypertension in her father; and Hyperthyroidism in her maternal grandmother, mother, and sister.  There is no history of Other. Social History:  reports that she has been smoking Cigarettes.  She has a 5 pack-year smoking history. She has never used smokeless tobacco. She reports that she does not drink alcohol or use illicit drugs.   Prenatal Transfer Tool  Maternal Diabetes: No Genetic Screening: Declined, too late Maternal Ultrasounds/Referrals: Normal Fetal Ultrasounds or other Referrals:  None Maternal Substance Abuse:  Yes:  Type:  Smoker Significant Maternal Medications:  None Significant Maternal Lab Results:  Lab values include: Group B Strep positive Other Comments:  unable to get abx for GBS  Review of Systems  Respiratory: Negative.   Cardiovascular: Negative.     Dilation:  (NVD) Station: 0 Exam by:: Fermina Mishkin Blood pressure 155/85, pulse 68, temperature 98.4 F (36.9 C), temperature source Oral, resp. rate 18. Maternal Exam:  Uterine Assessment: Contraction strength is firm.  Contraction frequency is regular.   Abdomen: Patient reports no abdominal tenderness. Estimated fetal weight is 8 lbs.   Fetal presentation: vertex  Introitus: Normal vulva. Normal vagina.  Amniotic fluid character: clear.  Pelvis: adequate for delivery.   Cervix: Cervix evaluated by digital exam.     Fetal Exam Fetal Monitor Review: Mode: ultrasound.   Variability: moderate (6-25 bpm).    Fetal State Assessment: Category I - tracings are normal.     Physical Exam  Constitutional: She appears well-developed and well-nourished.  Cardiovascular: Normal rate, regular rhythm and normal heart sounds.   No murmur heard. Respiratory: Effort normal and breath sounds normal. No respiratory distress. She has no wheezes.  GI: Soft.       gravid    Prenatal labs: ABO, Rh:  A pos Antibody:  neg Rubella:  Imm RPR:   NR HBsAg:   Neg HIV:   NR GBS:  positive GCT:  140 GTT:  normal  Assessment/Plan: IUP at 39 weeks admitted in active labor completely dilated, did not get abx for +GBS.  See delivery note.   Devonn Giampietro D 02/12/2012, 12:41 AM

## 2012-02-12 NOTE — Progress Notes (Signed)
IV out due to increase maternal movement, IV d/c'd intact with dressing CHayes, RN

## 2012-02-13 NOTE — Progress Notes (Addendum)
PPD #1 No problems, leg is better today Afeb, VSS Fundus firm, NT at U-1 Continue routine postpartum care, keep here today since baby did not get abx for GBS.   I neglected to mention in her delivery note that she had a mild shoulder dystocia that resolved with McRoberts maneuver and suprapubic pressure.  I could not addend the delivery note.

## 2012-02-13 NOTE — Clinical Social Work Maternal (Signed)
    LATE ENTRY FROM 02/12/12:  Clinical Social Work Department PSYCHOSOCIAL ASSESSMENT - MATERNAL/CHILD 02/13/2012  Patient:  SHELETHA, BOW  Account Number:  [id number]  Admit Date:  02/11/2012  Marjo Bicker Name:   Ines Bloomer    Clinical Social Worker:  Andy Gauss   Date/Time:  02/12/2012 12:00 N  Date Referred:  02/12/2012   Referral source  CN     Referred reason  Behavioral Health Issues  Substance Abuse   Other referral source:    I:  FAMILY / HOME ENVIRONMENT Child's legal guardian:  PARENT  Guardian - Name Guardian - Age Guardian - Address  Hannahgrace Lalli [address]; [place], [place] [address]  Armanda Magic 29 (same as above)   Other household support members/support persons Name Relationship DOB  Mya Jimesha Rising 02/03/10   RELATIVE    Other support:   FOB's mom, Terance Hart    II  PSYCHOSOCIAL DATA Information Source:  Patient Interview  Financial and Community Resources Employment:   Financial resources:  [employer] If Medicaid - County:  [employer] Other  Food Stamps  WIC   School / Grade:   Maternity Care Coordinator / Child Services Coordination / Early Interventions:  Cultural issues impacting care:    III  STRENGTHS Strengths  Adequate Resources  Home prepared for Child (including basic supplies)  Supportive family/friends   Strength comment:    IV  RISK FACTORS AND CURRENT PROBLEMS Current Problem:  YES   Risk Factor & Current Problem Patient Issue Family Issue Risk Factor / Current Problem Comment  Mental Illness Y N Hx of anxiety & bipolar disorder  Substance Abuse Y N Hx of MJ/Cocaine use   N N     V  SOCIAL WORK ASSESSMENT Sw referral received to assess history of mental illnesses and substance use.  Pt told Sw that she was diagnosed with anxiety, depression & bipolar in March 2013 by staff at [facility].  Pt states she was encouraged by a friend to seek mental health treatment after expressing depressed  feelings.  Pt symptoms were not treated with medication however she did attend therapy once or twice a month during pregnancy.  Pt attributes the source of her depression to FOB's incarceration at that time.  He is no longer in prison.  She thinks therapy is helpful and plans to continue treatment upon discharge.  She denies any history of SI/HI.  She denies any depression at the present time, as she reports feeling happy.  Pt continues to experience social anxiety however coping well.  She denies any illegal substance use in over 1 year.  She has all the necessary supplies for the infant and good family support.  Sw will continue to follow and assist further if needed.      VI SOCIAL WORK PLAN Social Work Plan  No Further Intervention Required / No Barriers to Discharge   Type of pt/family education:   If child protective services report - county:   If child protective services report - date:   Information/referral to community resources comment:   Other social work plan:

## 2012-02-14 MED ORDER — HYDROCODONE-ACETAMINOPHEN 5-325 MG PO TABS: 1.0000 | ORAL_TABLET | ORAL | Status: AC | PRN

## 2012-02-14 MED ORDER — IBUPROFEN 600 MG PO TABS: 600.0000 mg | ORAL_TABLET | Freq: Four times a day (QID) | ORAL | Status: AC

## 2012-02-14 NOTE — Progress Notes (Signed)
PPD #2 Cramping Afeb, VSS Fundus firm D/c home

## 2012-02-14 NOTE — Discharge Summary (Signed)
Obstetric Discharge Summary Reason for Admission: onset of labor Prenatal Procedures: none Intrapartum Procedures: spontaneous vaginal delivery with mild shoulder dystocia Postpartum Procedures: none Complications-Operative and Postpartum: none Hemoglobin  Date Value Range Status  02/12/2012 11.9* 12.0 - 15.0 g/dL Final     HCT  Date Value Range Status  02/12/2012 34.6* 36.0 - 46.0 % Final    Physical Exam:  General: alert Lochia: appropriate Uterine Fundus: firm  Discharge Diagnoses: Term Pregnancy-delivered and mild shoulder dystocia  Discharge Information: Date: 02/14/2012 Activity: pelvic rest Diet: routine Medications: Ibuprofen and Vicodin Condition: stable Instructions: refer to practice specific booklet Discharge to: home Follow-up Information    Follow up with Trenesha Alcaide D, MD. Schedule an appointment as soon as possible for a visit in 6 weeks.   Contact information:   [address] [place] [address] [phone]          Newborn Data: Live born female  Birth Weight: 8 lb 3 oz (3714 g) APGAR: 8, 9  Home with mother.  Lind Ausley D 02/14/2012, 8:08 AM

## 2014-04-25 ENCOUNTER — Encounter (HOSPITAL_COMMUNITY): Payer: Self-pay | Admitting: *Deleted

## 2016-08-19 ENCOUNTER — Encounter (HOSPITAL_COMMUNITY): Payer: Self-pay | Admitting: *Deleted

## 2016-08-19 ENCOUNTER — Emergency Department (HOSPITAL_COMMUNITY)
Admission: EM | Admit: 2016-08-19 | Discharge: 2016-08-20 | Disposition: A | Discharge status: Home / Self Care | Payer: Medicaid Other | Attending: Emergency Medicine | Admitting: Emergency Medicine

## 2016-08-19 DIAGNOSIS — F17210 Nicotine dependence, cigarettes, uncomplicated: Secondary | ICD-10-CM | POA: Insufficient documentation

## 2016-08-19 DIAGNOSIS — Z202 Contact with and (suspected) exposure to infections with a predominantly sexual mode of transmission: Secondary | ICD-10-CM | POA: Insufficient documentation

## 2016-08-19 DIAGNOSIS — Z91040 Latex allergy status: Secondary | ICD-10-CM | POA: Insufficient documentation

## 2016-08-19 LAB — WET PREP, GENITAL
Sperm: NONE SEEN
TRICH WET PREP: NONE SEEN
YEAST WET PREP: NONE SEEN

## 2016-08-19 LAB — I-STAT BETA HCG BLOOD, ED (MC, WL, AP ONLY): I-stat hCG, quantitative: <5 m[IU]/mL (ref <5)

## 2016-08-19 NOTE — ED Triage Notes (Signed)
Pt states that he was recently informed that her partner had herpes. Pt requesting testing for STD.

## 2016-08-19 NOTE — ED Provider Notes (Signed)
[facility]-EMERGENCY DEPT Provider Note   CSN: [id number] Arrival date & time: 08/19/16  1748  By signing my name below, I, Brenda Porter, attest that this documentation has been prepared under the direction and in the presence of non-physician practitioner, Kerrie Buffalo, NP. Electronically Signed: Modena Porter, Scribe. 08/19/2016. 10:33 PM.  History   Chief Complaint Chief Complaint  Patient presents with  . Exposure to STD   The history is provided by the patient. No language interpreter was used.   HPI Comments: Brenda Porter is a 31 y.o. female who presents to the Emergency Department complaining of potential STD exposure. She states she recently found out her partner has herpes and wants testing. She admits to intercourse with partner within the last 48 hours. No associated symptoms. She denies any other complaints. Patient states she wants tested for everything.  Past Medical History:  Diagnosis Date  . Bipolar 1 disorder, depressed (HCC)   . GERD (gastroesophageal reflux disease)   . Gestational diabetes   . Goiter   . UTI (urinary tract infection) in pregnancy in first trimester     Patient Active Problem List   Diagnosis Date Noted  . Active labor 02/12/2012  . SVD (spontaneous vaginal delivery) 02/12/2012  . Shoulder dystocia 02/12/2012    Past Surgical History:  Procedure Laterality Date  . WISDOM TOOTH EXTRACTION      OB History    Gravida Para Term Preterm AB Living   3 2 2   1 2    SAB TAB Ectopic Multiple Live Births       1   1       Home Medications    Prior to Admission medications   Medication Sig Start Date End Date Taking? Authorizing Provider  calcium carbonate (TUMS - DOSED IN MG ELEMENTAL CALCIUM) 500 MG chewable tablet Chew 2 tablets by mouth 3 (three) times daily as needed. For indigestion    Historical Provider, MD    Family History Family History  Problem Relation Age of Onset  . Hyperthyroidism Mother   . Hypertension Father   .  Diabetes Father   . Blindness Father   . Hyperthyroidism Sister   . Cancer Maternal Grandmother   . Hyperthyroidism Maternal Grandmother   . Other Neg Hx     Social History Social History  Substance Use Topics  . Smoking status: Current Every Day Smoker    Packs/day: 0.50    Years: 10.00    Types: Cigarettes  . Smokeless tobacco: Never Used  . Alcohol use No     Allergies   Codeine; Latex; and Oxycodone   Review of Systems Review of Systems  Constitutional: Negative for fever.  HENT: Negative.   Gastrointestinal: Negative for abdominal pain, nausea and vomiting.  Genitourinary: Negative for pelvic pain, vaginal bleeding and vaginal discharge.  Skin: Negative for rash.  Neurological: Negative for headaches.     Physical Exam Updated Vital Signs BP 123/70 (BP Location: Right Arm)   Pulse 60   Temp 98.1 F (36.7 C) (Oral)   Resp 18   LMP 07/02/2016   SpO2 99%   Physical Exam  Constitutional: She appears well-developed and well-nourished. No distress.  HENT:  Head: Normocephalic.  Eyes: Conjunctivae are normal.  Neck: Neck supple.  Cardiovascular: Normal rate and regular rhythm.   Pulmonary/Chest: Effort normal.  Abdominal: Soft.  Genitourinary:  Genitourinary Comments: External genitalia without lesions, white d/c vaginal vault, no CMT, no adnexal tenderness. Uterus without palpable enlargement.   Musculoskeletal:  Normal range of motion.  Neurological: She is alert.  Skin: Skin is warm and dry.  Psychiatric: She has a normal mood and affect.  Nursing note and vitals reviewed.    ED Treatments / Results  DIAGNOSTIC STUDIES: Oxygen Saturation is 99% on RA, normal by my interpretation.    COORDINATION OF CARE: 10:37 PM- Pt advised of plan for treatment and pt agrees.  Labs (all labs ordered are listed, but only abnormal results are displayed) Labs Reviewed  WET PREP, GENITAL - Abnormal; Notable for the following:       Result Value   Clue Cells  Wet Prep HPF POC PRESENT (*)    WBC, Wet Prep HPF POC MANY (*)    All other components within normal limits  HIV ANTIBODY (ROUTINE TESTING)  RPR  I-STAT BETA HCG BLOOD, ED ([facility], [facility], [facility] ONLY)  GC/CHLAMYDIA PROBE AMP () NOT [facility]   Radiology No results found.  Procedures Procedures (including critical care time)  Medications Ordered in ED Medications - No data to display   Initial Impression / Assessment and Plan / ED Course  I have reviewed the triage vital signs and the nursing notes.Pt arrives for asymptomatic STD check. Initial testing is negative for trichomonas. Discussed safe sexual practices. Discussed with the patient that there are no lesions at this time to test for HSV. Discussed signs and symptoms. Pt is advised to follow up for free testing at local health department in the future. Pt appears safe for discharge. Cultures pending  Final Clinical Impressions(s) / ED Diagnoses   Final diagnoses:  STD exposure    New Prescriptions Discharge Medication List as of 08/20/2016 12:19 AM     I personally performed the services described in this documentation, which was scribed in my presence. The recorded information has been reviewed and is accurate.     [facility] MorichesHope M Mackenzy Grumbine, [place]NP 08/20/16 1610    Gwyneth SproutWhitney Plunkett, MD 08/20/16 [phone]

## 2016-08-20 LAB — GC/CHLAMYDIA PROBE AMP (~~LOC~~) NOT AT ARMC
CHLAMYDIA, DNA PROBE: NEGATIVE
Neisseria Gonorrhea: POSITIVE — AB

## 2016-08-20 LAB — RPR: RPR: NONREACTIVE

## 2016-08-20 LAB — HIV ANTIBODY (ROUTINE TESTING W REFLEX): HIV Screen 4th Generation wRfx: NONREACTIVE

## 2016-08-20 NOTE — Discharge Instructions (Signed)
You do not have any lesions tonight to test for herpes. I am giving you information about herpes so you will know the signs and symptoms to look for. If any of your cultures come back positive someone will call you. Follow up with the health department for further testing or exposures.

## 2016-08-20 NOTE — ED Notes (Signed)
Pt verbalized understanding of d/c instructions and has no further questions. Pt is stable, A&Ox4, VSS.  

## 2016-09-26 ENCOUNTER — Encounter: Payer: Self-pay | Admitting: Emergency Medicine

## 2016-09-26 ENCOUNTER — Emergency Department (HOSPITAL_COMMUNITY)
Admission: EM | Admit: 2016-09-26 | Discharge: 2016-09-26 | Disposition: A | Discharge status: Home / Self Care | Payer: Medicaid Other | Attending: Emergency Medicine | Admitting: Emergency Medicine

## 2016-09-26 DIAGNOSIS — F17210 Nicotine dependence, cigarettes, uncomplicated: Secondary | ICD-10-CM | POA: Insufficient documentation

## 2016-09-26 DIAGNOSIS — Z79899 Other long term (current) drug therapy: Secondary | ICD-10-CM | POA: Insufficient documentation

## 2016-09-26 DIAGNOSIS — Z202 Contact with and (suspected) exposure to infections with a predominantly sexual mode of transmission: Secondary | ICD-10-CM | POA: Insufficient documentation

## 2016-09-26 DIAGNOSIS — Z91040 Latex allergy status: Secondary | ICD-10-CM | POA: Insufficient documentation

## 2016-09-26 DIAGNOSIS — N760 Acute vaginitis: Secondary | ICD-10-CM | POA: Insufficient documentation

## 2016-09-26 DIAGNOSIS — M25511 Pain in right shoulder: Secondary | ICD-10-CM | POA: Insufficient documentation

## 2016-09-26 LAB — WET PREP, GENITAL
SPERM: NONE SEEN
TRICH WET PREP: NONE SEEN
YEAST WET PREP: NONE SEEN

## 2016-09-26 LAB — POC URINE PREG, ED: Preg Test, Ur: NEGATIVE

## 2016-09-26 MED ORDER — CEFTRIAXONE SODIUM 250 MG IJ SOLR
250.0000 mg | Freq: Once | INTRAMUSCULAR | Status: AC
  Administered 2016-09-26: 250 mg via INTRAMUSCULAR
  Filled 2016-09-26: qty 250

## 2016-09-26 MED ORDER — AZITHROMYCIN 250 MG PO TABS
1000.0000 mg | ORAL_TABLET | Freq: Once | ORAL | Status: AC
  Administered 2016-09-26: 1000 mg via ORAL
  Filled 2016-09-26: qty 4

## 2016-09-26 MED ORDER — METHOCARBAMOL 500 MG PO TABS: 500.0000 mg | ORAL_TABLET | Freq: Four times a day (QID) | ORAL | 0 refills | Status: DC | PRN

## 2016-09-26 MED ORDER — LIDOCAINE HCL 1 % IJ SOLN
INTRAMUSCULAR | Status: AC
  Administered 2016-09-26: 0.9 mL
  Filled 2016-09-26: qty 20

## 2016-09-26 NOTE — ED Triage Notes (Signed)
Pt states that she has had atraumatic R shoulder pain x 3 days. Pt also states she is having lower abd pain similar to when she had gonnorhea recently. States she and her partner were both treated but they are still having symptoms. Pt also missed her period in March. Alert and oriented.

## 2016-09-26 NOTE — ED Notes (Signed)
Bed: [facility] Expected date:  Expected time:  Means of arrival:  Comments: 

## 2016-09-26 NOTE — Discharge Instructions (Signed)
Begin taking Robaxin as needed for muscle spasms. Follow up with PCP for further evaluation. Return to ED for worsening symptoms, pelvic pain, bleeding or fever.

## 2016-09-26 NOTE — ED Provider Notes (Signed)
[facility]-EMERGENCY DEPT Provider Note   CSN: [id number] Arrival date & time: 09/26/16  1815     History   Chief Complaint Chief Complaint  Patient presents with  . Vaginal Discharge  . Shoulder Pain    HPI Brenda Porter is a 31 y.o. female.  Patient presents with 2 day history of R shoulder pain that radiates down arm and associated with tingling feeling. Denies any trauma or prior surgeries. States she is still able to have full ROM and sensation. States history of similar episode in past which improved with a few days of muscle relaxer. Patient is also c/o vaginal discharge that has not improved since her last visit approx 6 weeks ago. Describes it as "white and clumpy." States she received her tx for GC and chlamydia, but is unsure if partner did. She also reports suprapubic pain radiating to her back. No vaginal bleeding or dysuria. States she missed her period last month. Is sexually active with just that 1 partner and does not use protection and is not on OCP. Denies chest pain, SOB, leg swelling, headache, vision changes, diarrhea, vomiting or hematuria.      Past Medical History:  Diagnosis Date  . Bipolar 1 disorder, depressed (HCC)   . GERD (gastroesophageal reflux disease)   . Gestational diabetes   . Goiter   . UTI (urinary tract infection) in pregnancy in first trimester     Patient Active Problem List   Diagnosis Date Noted  . Active labor 02/12/2012  . SVD (spontaneous vaginal delivery) 02/12/2012  . Shoulder dystocia 02/12/2012    Past Surgical History:  Procedure Laterality Date  . WISDOM TOOTH EXTRACTION      OB History    Gravida Para Term Preterm AB Living   SAB TAB Ectopic Multiple Live Births       1   1       Home Medications    Prior to Admission medications   Medication Sig Start Date End Date Taking? Authorizing Provider  acetaminophen (TYLENOL) 325 MG tablet Take 650 mg by mouth every 6 (six) hours as needed (pain).    Yes Historical Provider, MD  methocarbamol (ROBAXIN) 500 MG tablet Take 1 tablet (500 mg total) by mouth every 6 (six) hours as needed for muscle spasms. 09/26/16   Dietrich Pates, PA-C    Family History Family History  Problem Relation Age of Onset  . Hyperthyroidism Mother   . Hypertension Father   . Diabetes Father   . Blindness Father   . Hyperthyroidism Sister   . Cancer Maternal Grandmother   . Hyperthyroidism Maternal Grandmother   . Other Neg Hx     Social History Social History  Substance Use Topics  . Smoking status: Current Every Day Smoker    Packs/day: 0.50    Years: 10.00    Types: Cigarettes  . Smokeless tobacco: Never Used  . Alcohol use No     Allergies   Codeine; Latex; and Oxycodone   Review of Systems Review of Systems  Constitutional: Negative for appetite change, chills and fever.  HENT: Negative for sneezing and sore throat.   Eyes: Negative for visual disturbance.  Respiratory: Negative for cough, chest tightness, shortness of breath and wheezing.   Cardiovascular: Negative for chest pain and palpitations.  Gastrointestinal: Negative for abdominal pain, blood in stool, constipation, diarrhea, nausea and vomiting.  Genitourinary: Positive for vaginal discharge and vaginal pain. Negative for dysuria, hematuria  and urgency.  Musculoskeletal: Positive for arthralgias, back pain and myalgias.  Skin: Negative for rash.  Neurological: Negative for dizziness, weakness and light-headedness.     Physical Exam Updated Vital Signs BP 117/64 (BP Location: Left Arm)   Pulse 65   Temp 98.7 F (37.1 C) (Oral)   Resp 18   LMP 07/29/2016 (Approximate)   SpO2 98%   Physical Exam  Constitutional: She is oriented to person, place, and time. She appears well-developed and well-nourished. No distress.  HENT:  Head: Normocephalic and atraumatic.  Nose: Nose normal.  Eyes: Conjunctivae and EOM are normal. Right eye exhibits no discharge. Left eye exhibits no  discharge. No scleral icterus.  Neck: Normal range of motion. Neck supple.  Cardiovascular: Normal rate, regular rhythm, normal heart sounds and intact distal pulses.  Exam reveals no gallop and no friction rub.   No murmur heard. Pulmonary/Chest: Effort normal and breath sounds normal. No respiratory distress.  Abdominal: Soft. Bowel sounds are normal. She exhibits no distension. There is tenderness (Suprapubic). There is no guarding.  Genitourinary: There is no rash or lesion on the right labia. There is no rash or lesion on the left labia. Cervix exhibits discharge. Cervix exhibits no motion tenderness and no friability. Right adnexum displays no tenderness. Left adnexum displays no tenderness. Vaginal discharge found.  Genitourinary Comments: Thin white discharge noted around cervix. No friability. Patient reported suprapubic tenderness with cervical motion exam. No adnexal tenderness.  Musculoskeletal: Normal range of motion. She exhibits tenderness (TTP of R shoulder area and trapezius). She exhibits no edema.  Neurological: She is alert and oriented to person, place, and time. No sensory deficit. She exhibits normal muscle tone. Coordination normal.  Skin: Skin is warm and dry. No rash noted.  Psychiatric: She has a normal mood and affect.  Nursing note and vitals reviewed.    ED Treatments / Results  Labs (all labs ordered are listed, but only abnormal results are displayed) Labs Reviewed  WET PREP, GENITAL - Abnormal; Notable for the following:       Result Value   Clue Cells Wet Prep HPF POC PRESENT (*)    WBC, Wet Prep HPF POC FEW (*)    All other components within normal limits  POC URINE PREG, ED  GC/CHLAMYDIA PROBE AMP ([place]) NOT AT [facility]  WET PREP  (BD AFFIRM) ()    EKG  EKG Interpretation None       Radiology No results found.  Procedures Procedures (including critical care time)  Medications Ordered in ED Medications  cefTRIAXone  (ROCEPHIN) injection 250 mg (not administered)  azithromycin (ZITHROMAX) tablet 1,000 mg (not administered)     Initial Impression / Assessment and Plan / ED Course  I have reviewed the triage vital signs and the nursing notes.  Pertinent labs & imaging results that were available during my care of the patient were reviewed by me and considered in my medical decision making (see chart for details).     Patient's history and symptoms concerning for vaginitis vs. STD. Arm pain could be due to muscle spasm. Low likelihood of muscle strain or fracture due to no history of injury or trauma to the area. Considered disseminated GC/chlamydia infection; however, there is more tenderness along muscular area and no evidence of red, hot or infected joint and patient has full ROM and afebrile. Pelvic exam revealed thin white discharge and slight CMT. Wet prep showed clue cells present and few WBC. Regardless, suspicion for GC/chlamydia remains  high. We will treat empirically due to history and discharge present.  Patient was urged to inform her partner and make sure they are treated as well, as this is the only way her symptoms will resolve. Will give Robaxin for shoulder pain. Return precautions given.  Final Clinical Impressions(s) / ED Diagnoses   Final diagnoses:  Right shoulder pain, unspecified chronicity  Acute vaginitis  Exposure to sexually transmitted disease (STD)    New Prescriptions New Prescriptions   METHOCARBAMOL (ROBAXIN) 500 MG TABLET    Take 1 tablet (500 mg total) by mouth every 6 (six) hours as needed for muscle spasms.     Dietrich Pates, PA-C 09/26/16 2142    Maia Plan, MD 09/27/16 [id number]

## 2016-09-27 LAB — GC/CHLAMYDIA PROBE AMP (~~LOC~~) NOT AT ARMC
Chlamydia: NEGATIVE
Neisseria Gonorrhea: NEGATIVE

## 2017-03-25 ENCOUNTER — Inpatient Hospital Stay (HOSPITAL_COMMUNITY): Payer: Medicaid Other

## 2017-03-25 ENCOUNTER — Encounter (HOSPITAL_COMMUNITY): Payer: Self-pay | Admitting: *Deleted

## 2017-03-25 ENCOUNTER — Inpatient Hospital Stay (HOSPITAL_COMMUNITY)
Admission: AD | Admit: 2017-03-25 | Discharge: 2017-03-25 | Disposition: A | Discharge status: Home / Self Care | Payer: Medicaid Other | Source: Ambulatory Visit | Attending: Family Medicine | Admitting: Family Medicine

## 2017-03-25 DIAGNOSIS — B9689 Other specified bacterial agents as the cause of diseases classified elsewhere: Secondary | ICD-10-CM | POA: Diagnosis not present

## 2017-03-25 DIAGNOSIS — F17210 Nicotine dependence, cigarettes, uncomplicated: Secondary | ICD-10-CM | POA: Diagnosis not present

## 2017-03-25 DIAGNOSIS — Z91040 Latex allergy status: Secondary | ICD-10-CM | POA: Diagnosis not present

## 2017-03-25 DIAGNOSIS — Z880 Allergy status to penicillin: Secondary | ICD-10-CM | POA: Diagnosis not present

## 2017-03-25 DIAGNOSIS — Z833 Family history of diabetes mellitus: Secondary | ICD-10-CM | POA: Insufficient documentation

## 2017-03-25 DIAGNOSIS — O3680X0 Pregnancy with inconclusive fetal viability, not applicable or unspecified: Secondary | ICD-10-CM

## 2017-03-25 DIAGNOSIS — Z8349 Family history of other endocrine, nutritional and metabolic diseases: Secondary | ICD-10-CM | POA: Diagnosis not present

## 2017-03-25 DIAGNOSIS — Z8249 Family history of ischemic heart disease and other diseases of the circulatory system: Secondary | ICD-10-CM | POA: Diagnosis not present

## 2017-03-25 DIAGNOSIS — O23591 Infection of other part of genital tract in pregnancy, first trimester: Secondary | ICD-10-CM | POA: Diagnosis not present

## 2017-03-25 DIAGNOSIS — Z885 Allergy status to narcotic agent status: Secondary | ICD-10-CM | POA: Diagnosis not present

## 2017-03-25 DIAGNOSIS — R109 Unspecified abdominal pain: Secondary | ICD-10-CM

## 2017-03-25 DIAGNOSIS — O26891 Other specified pregnancy related conditions, first trimester: Secondary | ICD-10-CM

## 2017-03-25 DIAGNOSIS — Z3A11 11 weeks gestation of pregnancy: Secondary | ICD-10-CM | POA: Diagnosis not present

## 2017-03-25 DIAGNOSIS — O99331 Smoking (tobacco) complicating pregnancy, first trimester: Secondary | ICD-10-CM | POA: Diagnosis not present

## 2017-03-25 DIAGNOSIS — R1032 Left lower quadrant pain: Secondary | ICD-10-CM | POA: Insufficient documentation

## 2017-03-25 DIAGNOSIS — N760 Acute vaginitis: Secondary | ICD-10-CM | POA: Diagnosis not present

## 2017-03-25 LAB — URINALYSIS, ROUTINE W REFLEX MICROSCOPIC
Bacteria, UA: NONE SEEN
Bilirubin Urine: NEGATIVE
Glucose, UA: NEGATIVE mg/dL
Hgb urine dipstick: NEGATIVE
Ketones, ur: NEGATIVE mg/dL
Nitrite: NEGATIVE
PH: 6 (ref 5.0–8.0)
Protein, ur: NEGATIVE mg/dL
Specific Gravity, Urine: 1.01 (ref 1.005–1.030)

## 2017-03-25 LAB — CBC WITH DIFFERENTIAL/PLATELET
Basophils Absolute: 0 10*3/uL (ref 0.0–0.1)
Basophils Relative: 0 %
Eosinophils Absolute: 0.1 10*3/uL (ref 0.0–0.7)
Eosinophils Relative: 1 %
HEMATOCRIT: 40 % (ref 36.0–46.0)
HEMOGLOBIN: 13.4 g/dL (ref 12.0–15.0)
LYMPHS PCT: 38 %
Lymphs Abs: 5.1 10*3/uL — ABNORMAL HIGH (ref 0.7–4.0)
MCH: 31.2 pg (ref 26.0–34.0)
MCHC: 33.5 g/dL (ref 30.0–36.0)
MCV: 93.2 fL (ref 78.0–100.0)
MONOS PCT: 2 %
Monocytes Absolute: 0.2 10*3/uL (ref 0.1–1.0)
NEUTROS ABS: 7.7 10*3/uL (ref 1.7–7.7)
NEUTROS PCT: 59 %
Platelets: 254 10*3/uL (ref 150–400)
RBC: 4.29 MIL/uL (ref 3.87–5.11)
RDW: 13.5 % (ref 11.5–15.5)
WBC: 13.2 10*3/uL — ABNORMAL HIGH (ref 4.0–10.5)

## 2017-03-25 LAB — COMPREHENSIVE METABOLIC PANEL
ALBUMIN: 3.8 g/dL (ref 3.5–5.0)
ALK PHOS: 42 U/L (ref 38–126)
ALT: 13 U/L — ABNORMAL LOW (ref 14–54)
ANION GAP: 7 (ref 5–15)
AST: 13 U/L — ABNORMAL LOW (ref 15–41)
BILIRUBIN TOTAL: 0.5 mg/dL (ref 0.3–1.2)
BUN: 10 mg/dL (ref 6–20)
CALCIUM: 9.2 mg/dL (ref 8.9–10.3)
CO2: 24 mmol/L (ref 22–32)
Chloride: 106 mmol/L (ref 101–111)
Creatinine, Ser: 0.83 mg/dL (ref 0.44–1.00)
GFR calc Af Amer: >60 mL/min (ref >60)
GFR calc non Af Amer: >60 mL/min (ref >60)
GLUCOSE: 106 mg/dL — AB (ref 65–99)
Potassium: 4.2 mmol/L (ref 3.5–5.1)
Sodium: 137 mmol/L (ref 135–145)
TOTAL PROTEIN: 7.1 g/dL (ref 6.5–8.1)

## 2017-03-25 LAB — WET PREP, GENITAL
Sperm: NONE SEEN
TRICH WET PREP: NONE SEEN
YEAST WET PREP: NONE SEEN

## 2017-03-25 LAB — POCT PREGNANCY, URINE: Preg Test, Ur: POSITIVE — AB

## 2017-03-25 LAB — HCG, QUANTITATIVE, PREGNANCY: HCG, BETA CHAIN, QUANT, S: 4490 m[IU]/mL — AB (ref <5)

## 2017-03-25 LAB — LIPASE, BLOOD: Lipase: 30 U/L (ref 11–51)

## 2017-03-25 MED ORDER — METRONIDAZOLE 500 MG PO TABS: 500.0000 mg | ORAL_TABLET | Freq: Two times a day (BID) | ORAL | 0 refills | Status: DC

## 2017-03-25 NOTE — MAU Provider Note (Signed)
History     CSN: [id number]  Arrival date and time: 03/25/17 1633   None     Chief Complaint  Patient presents with  . Possible Pregnancy  . Abdominal Pain  . Back Pain   Brenda Porter is a 31 y.o. [phone]  gestation and hx of GC, trichomonas, ectopic pregnancy x1 and SAB x1 who presents to MAU with abdominal pain that started 3 days ago and has gotten worse. The pain is located in the LLQ. She reports having fever and chills.    Possible Pregnancy  This is a new problem. The current episode started in the past 7 days. Associated symptoms include abdominal pain, chills and a fever. Pertinent negatives include no chest pain, headaches or rash.  Abdominal Pain  This is a new problem. The current episode started in the past 7 days. The onset quality is gradual. The problem occurs constantly. The problem has been gradually worsening. The pain is located in the LLQ. The pain is moderate. The quality of the pain is sharp. Associated symptoms include a fever and frequency. Pertinent negatives include no dysuria or headaches. The pain is relieved by nothing. She has tried nothing for the symptoms.  Back Pain  Associated symptoms include abdominal pain and a fever. Pertinent negatives include no chest pain, dysuria or headaches.    OB History    Gravida Para Term Preterm AB Living   SAB TAB Ectopic Multiple Live Births       1   2      Past Medical History:  Diagnosis Date  . Bipolar 1 disorder, depressed (HCC)   . GERD (gastroesophageal reflux disease)   . Gestational diabetes   . Goiter   . UTI (urinary tract infection) in pregnancy in first trimester     Past Surgical History:  Procedure Laterality Date  . WISDOM TOOTH EXTRACTION      Family History  Problem Relation Age of Onset  . Hyperthyroidism Mother   . Hypertension Father   . Diabetes Father   . Blindness Father   . Hyperthyroidism Sister   . Cancer Maternal Grandmother   .  Hyperthyroidism Maternal Grandmother   . Other Neg Hx     Social History  Substance Use Topics  . Smoking status: Current Every Day Smoker    Packs/day: 0.50    Years: 10.00    Types: Cigarettes  . Smokeless tobacco: Never Used  . Alcohol use No    Allergies:  Allergies  Allergen Reactions  . Codeine Nausea And Vomiting, Swelling and Rash  . Latex Itching and Rash  . Oxycodone Swelling and Rash    Hydrocodone OK  . Penicillins Rash    Prescriptions Prior to Admission  Medication Sig Dispense Refill Last Dose  . acetaminophen (TYLENOL) 325 MG tablet Take 650 mg by mouth every 6 (six) hours as needed (pain).   09/26/2016 at Unknown time  . methocarbamol (ROBAXIN) 500 MG tablet Take 1 tablet (500 mg total) by mouth every 6 (six) hours as needed for muscle spasms. 20 tablet 0     Review of Systems  Constitutional: Positive for chills and fever.  HENT: Negative.   Respiratory: Negative for shortness of breath.   Cardiovascular: Negative for chest pain.  Gastrointestinal: Positive for abdominal pain.  Genitourinary: Positive for frequency. Negative for dysuria.  Musculoskeletal: Positive for back pain.  Skin: Negative for rash.  Neurological: Negative for headaches.  Hematological:  Negative for adenopathy.  Psychiatric/Behavioral: The patient is not nervous/anxious.    Physical Exam   Blood pressure 125/71, pulse 67, temperature 98.5 F (36.9 C), temperature source Oral, resp. rate 18, height  (1.651 m), weight 201 lb (91.2 kg), last menstrual period 01/06/2017, SpO2 100 %, unknown if currently breastfeeding.  Physical Exam  Nursing note and vitals reviewed. Constitutional: She is oriented to person, place, and time. She appears well-developed and well-nourished. No distress.  HENT:  Head: Normocephalic and atraumatic.  Eyes: EOM are normal.  Neck: Neck supple.  Cardiovascular: Regular rhythm.  Bradycardia present.   Respiratory: Effort normal and breath sounds  normal.  GI: Soft. There is tenderness in the left lower quadrant. There is no CVA tenderness.  Genitourinary:  Genitourinary Comments: External genitalia without lesions, white d/c vaginal vault, cervix long, closed, tender left adnexa. Uterus approximately 10 week size.   Musculoskeletal: Normal range of motion.  Neurological: She is alert and oriented to person, place, and time.  Skin: Skin is warm and dry. No rash noted.  Psychiatric: She has a normal mood and affect. Her behavior is normal. Judgment and thought content normal.    MAU Course  Procedures Care turned over to [facility], CNM @ 9:10 pm. Labs and ultrasound pending.    Assessment and Plan    NEESE,HOPE 03/25/2017, 9:14 PM   US Ob Comp Less 14 Wks  Result Date: 03/25/2017 CLINICAL DATA:  Left lower abdominal pain for 3 days. History of ectopic pregnancy. EXAM: OBSTETRIC <14 WK [place] AND TRANSVAGINAL OB US TECHNIQUE: Both transabdominal and transvaginal ultrasound examinations were performed for complete evaluation of the gestation as well as the maternal uterus, adnexal regions, and pelvic cul-de-sac. Transvaginal technique was performed to assess early pregnancy. COMPARISON:  None. FINDINGS: Intrauterine gestational sac: Single Yolk sac:  Not Visualized. Embryo:  Not Visualized. MSD: 6.1  mm   5 w   2  d CRL:    mm    w    d                  [place] EDC: Subchorionic hemorrhage: Probable small subchorionic hemorrhage measuring 1.8 x 0.8 x 1 cm Maternal uterus/adnexae: Normal in appearance. Trace fluid in the pelvis, likely physiologic. IMPRESSION: 1. The small fluid collection in the endometrium is favored to represent an early gestational sac with an adjacent subchorionic hemorrhage. However, no yolk sac or fetal pole is seen at this time to confirm an intrauterine pregnancy. Recommend clinical correlation and close follow-up as clinically warranted. Electronically Signed   By: Gerome Sam III M.D   On: 03/25/2017 21:47    US Ob Transvaginal  Result Date: 03/25/2017 CLINICAL DATA:  Left lower abdominal pain for 3 days. History of ectopic pregnancy. EXAM: OBSTETRIC <14 WK [place] AND TRANSVAGINAL OB US TECHNIQUE: Both transabdominal and transvaginal ultrasound examinations were performed for complete evaluation of the gestation as well as the maternal uterus, adnexal regions, and pelvic cul-de-sac. Transvaginal technique was performed to assess early pregnancy. COMPARISON:  None. FINDINGS: Intrauterine gestational sac: Single Yolk sac:  Not Visualized. Embryo:  Not Visualized. MSD: 6.1  mm   5 w   2  d CRL:    mm    w    d                  [place] EDC: Subchorionic hemorrhage: Probable small subchorionic hemorrhage measuring 1.8 x 0.8 x 1 cm Maternal uterus/adnexae: Normal in appearance. Trace  fluid in the pelvis, likely physiologic. IMPRESSION: 1. The small fluid collection in the endometrium is favored to represent an early gestational sac with an adjacent subchorionic hemorrhage. However, no yolk sac or fetal pole is seen at this time to confirm an intrauterine pregnancy. Recommend clinical correlation and close follow-up as clinically warranted. Electronically Signed   By: Gerome Sam III M.D   On: 03/25/2017 21:47    A: 1. Pregnancy of unknown anatomic location   2. Abdominal pain during pregnancy, first trimester   3.  Bacterial Vaginosis  P: D/C home with ectopic precautions F/U in [facility] office in 48 hours for repeat hcg Flagyl 500 mg BID x 7 days, pt may start PRN with symptoms of BV  Sharen Counter, CNM 10:39 PM

## 2017-03-25 NOTE — MAU Note (Signed)
Pt reports lower abd, lower back pain for 3 days. Denies dysuria.

## 2017-03-26 LAB — GC/CHLAMYDIA PROBE AMP (~~LOC~~) NOT AT ARMC
CHLAMYDIA, DNA PROBE: NEGATIVE
Neisseria Gonorrhea: NEGATIVE

## 2017-03-27 ENCOUNTER — Ambulatory Visit: Payer: Self-pay | Admitting: *Deleted

## 2017-03-27 DIAGNOSIS — Z3490 Encounter for supervision of normal pregnancy, unspecified, unspecified trimester: Secondary | ICD-10-CM

## 2017-03-27 NOTE — Progress Notes (Signed)
Here for stat bhcg but per review had [place] verifying IUP but no gestational sac.  Patient denies bleeding ,states she is still having some abd/pelvic pain up to 6 at times.  Discussed with Dr. Debroah Loop and plan of care is does not need stat bhcg- ordered US in 14 days from last [place] . Scheduled and informed patient of plan. Also instructed her to go to [facility]au for severe pain or heavy bleeding. She voices understanding.

## 2017-04-08 ENCOUNTER — Ambulatory Visit: Payer: Self-pay | Admitting: General Practice

## 2017-04-08 ENCOUNTER — Encounter: Payer: Self-pay | Admitting: General Practice

## 2017-04-08 ENCOUNTER — Ambulatory Visit (HOSPITAL_COMMUNITY)
Admission: RE | Admit: 2017-04-08 | Discharge: 2017-04-08 | Disposition: A | Discharge status: Home / Self Care | Payer: Medicaid Other | Source: Ambulatory Visit | Attending: Obstetrics & Gynecology | Admitting: Obstetrics & Gynecology

## 2017-04-08 DIAGNOSIS — Z3A01 Less than 8 weeks gestation of pregnancy: Secondary | ICD-10-CM | POA: Insufficient documentation

## 2017-04-08 DIAGNOSIS — O219 Vomiting of pregnancy, unspecified: Secondary | ICD-10-CM

## 2017-04-08 DIAGNOSIS — Z3491 Encounter for supervision of normal pregnancy, unspecified, first trimester: Secondary | ICD-10-CM | POA: Diagnosis not present

## 2017-04-08 DIAGNOSIS — Z3490 Encounter for supervision of normal pregnancy, unspecified, unspecified trimester: Secondary | ICD-10-CM

## 2017-04-08 MED ORDER — PROMETHAZINE HCL 12.5 MG PO TABS: 12.5000 mg | ORAL_TABLET | Freq: Four times a day (QID) | ORAL | 0 refills | Status: DC | PRN

## 2017-04-08 NOTE — Progress Notes (Signed)
Patient here for viability ultrasound results. Reviewed with Dr Nira Retort who finds living IUP. Patient should begin prenatal care. Informed patient of results, provided pictures, and discussed dating. Patient denies taking medication or vitamins. Encouraged patient to begin PNV and start care. Pregnancy verification letter given. Patient verbalized understanding & requests Rx for nausea. Rx obtained via [facility]. Patient had no other questions

## 2017-04-24 ENCOUNTER — Inpatient Hospital Stay (HOSPITAL_COMMUNITY)
Admission: AD | Admit: 2017-04-24 | Discharge: 2017-04-24 | Disposition: A | Discharge status: Home / Self Care | Payer: Medicaid Other | Source: Ambulatory Visit | Attending: Obstetrics and Gynecology | Admitting: Obstetrics and Gynecology

## 2017-04-24 ENCOUNTER — Encounter (HOSPITAL_COMMUNITY): Payer: Self-pay | Admitting: *Deleted

## 2017-04-24 DIAGNOSIS — O209 Hemorrhage in early pregnancy, unspecified: Secondary | ICD-10-CM | POA: Diagnosis not present

## 2017-04-24 DIAGNOSIS — R109 Unspecified abdominal pain: Secondary | ICD-10-CM

## 2017-04-24 DIAGNOSIS — Z3A01 Less than 8 weeks gestation of pregnancy: Secondary | ICD-10-CM | POA: Diagnosis not present

## 2017-04-24 DIAGNOSIS — O26899 Other specified pregnancy related conditions, unspecified trimester: Secondary | ICD-10-CM

## 2017-04-24 DIAGNOSIS — Z3A09 9 weeks gestation of pregnancy: Secondary | ICD-10-CM | POA: Diagnosis not present

## 2017-04-24 DIAGNOSIS — O99331 Smoking (tobacco) complicating pregnancy, first trimester: Secondary | ICD-10-CM | POA: Insufficient documentation

## 2017-04-24 DIAGNOSIS — O26891 Other specified pregnancy related conditions, first trimester: Secondary | ICD-10-CM

## 2017-04-24 LAB — URINALYSIS, ROUTINE W REFLEX MICROSCOPIC
BILIRUBIN URINE: NEGATIVE
Glucose, UA: NEGATIVE mg/dL
Ketones, ur: NEGATIVE mg/dL
LEUKOCYTES UA: NEGATIVE
Nitrite: NEGATIVE
PROTEIN: NEGATIVE mg/dL
SPECIFIC GRAVITY, URINE: 1.018 (ref 1.005–1.030)
pH: 6 (ref 5.0–8.0)

## 2017-04-24 LAB — WET PREP, GENITAL
Clue Cells Wet Prep HPF POC: NONE SEEN
SPERM: NONE SEEN
TRICH WET PREP: NONE SEEN
YEAST WET PREP: NONE SEEN

## 2017-04-24 NOTE — Discharge Instructions (Signed)
Vaginal Bleeding During Pregnancy, First Trimester A small amount of bleeding (spotting) from the vagina is common in early pregnancy. Sometimes the bleeding is normal and is not a problem, and sometimes it is a sign of something serious. Be sure to tell your doctor about any bleeding from your vagina right away. Follow these instructions at home:  Watch your condition for any changes.  Follow your doctor's instructions about how active you can be.  If you are on bed rest: ? You may need to stay in bed and only get up to use the bathroom. ? You may be allowed to do some activities. ? If you need help, make plans for someone to help you.  Write down: ? The number of pads you use each day. ? How often you change pads. ? How soaked (saturated) your pads are.  Do not use tampons.  Do not douche.  Do not have sex or orgasms until your doctor says it is okay.  If you pass any tissue from your vagina, save the tissue so you can show it to your doctor.  Only take medicines as told by your doctor.  Do not take aspirin because it can make you bleed.  Keep all follow-up visits as told by your doctor. Contact a doctor if:  You bleed from your vagina.  You have cramps.  You have labor pains.  You have a fever that does not go away after you take medicine. Get help right away if:  You have very bad cramps in your back or belly (abdomen).  You pass large clots or tissue from your vagina.  You bleed more.  You feel light-headed or weak.  You pass out (faint).  You have chills.  You are leaking fluid or have a gush of fluid from your vagina.  You pass out while pooping (having a bowel movement). This information is not intended to replace advice given to you by your health care provider. Make sure you discuss any questions you have with your health care provider. Document Released: 10/25/2013 Document Revised: 11/16/2015 Document Reviewed: 02/15/2013 Elsevier Interactive  Patient Education  2018 [employer]. First Trimester of Pregnancy The first trimester of pregnancy is from week 1 until the end of week 13 (months 1 through 3). A week after a sperm fertilizes an egg, the egg will implant on the wall of the uterus. This embryo will begin to develop into a baby. Genes from you and your partner will form the baby. The female genes will determine whether the baby will be a boy or a girl. At 6-8 weeks, the eyes and face will be formed, and the heartbeat can be seen on ultrasound. At the end of 12 weeks, all the baby's organs will be formed. Now that you are pregnant, you will want to do everything you can to have a healthy baby. Two of the most important things are to get good prenatal care and to follow your health care provider's instructions. Prenatal care is all the medical care you receive before the baby's birth. This care will help prevent, find, and treat any problems during the pregnancy and childbirth. Body changes during your first trimester Your body goes through many changes during pregnancy. The changes vary from woman to woman.  You may gain or lose a couple of pounds at first.  You may feel sick to your stomach (nauseous) and you may throw up (vomit). If the vomiting is uncontrollable, call your health care provider.  You  may tire easily.  You may develop headaches that can be relieved by medicines. All medicines should be approved by your health care provider.  You may urinate more often. Painful urination may mean you have a bladder infection.  You may develop heartburn as a result of your pregnancy.  You may develop constipation because certain hormones are causing the muscles that push stool through your intestines to slow down.  You may develop hemorrhoids or swollen veins (varicose veins).  Your breasts may begin to grow larger and become tender. Your nipples may stick out more, and the tissue that surrounds them (areola) may become  darker.  Your gums may bleed and may be sensitive to brushing and flossing.  Dark spots or blotches (chloasma, mask of pregnancy) may develop on your face. This will likely fade after the baby is born.  Your menstrual periods will stop.  You may have a loss of appetite.  You may develop cravings for certain kinds of food.  You may have changes in your emotions from day to day, such as being excited to be pregnant or being concerned that something may go wrong with the pregnancy and baby.  You may have more vivid and strange dreams.  You may have changes in your hair. These can include thickening of your hair, rapid growth, and changes in texture. Some women also have hair loss during or after pregnancy, or hair that feels dry or thin. Your hair will most likely return to normal after your baby is born.  What to expect at prenatal visits During a routine prenatal visit:  You will be weighed to make sure you and the baby are growing normally.  Your blood pressure will be taken.  Your abdomen will be measured to track your baby's growth.  The fetal heartbeat will be listened to between weeks 10 and 14 of your pregnancy.  Test results from any previous visits will be discussed.  Your health care provider may ask you:  How you are feeling.  If you are feeling the baby move.  If you have had any abnormal symptoms, such as leaking fluid, bleeding, severe headaches, or abdominal cramping.  If you are using any tobacco products, including cigarettes, chewing tobacco, and electronic cigarettes.  If you have any questions.  Other tests that may be performed during your first trimester include:  Blood tests to find your blood type and to check for the presence of any previous infections. The tests will also be used to check for low iron levels (anemia) and protein on red blood cells (Rh antibodies). Depending on your risk factors, or if you previously had diabetes during pregnancy,  you may have tests to check for high blood sugar that affects pregnant women (gestational diabetes).  Urine tests to check for infections, diabetes, or protein in the urine.  An ultrasound to confirm the proper growth and development of the baby.  Fetal screens for spinal cord problems (spina bifida) and Down syndrome.  HIV (human immunodeficiency virus) testing. Routine prenatal testing includes screening for HIV, unless you choose not to have this test.  You may need other tests to make sure you and the baby are doing well.  Follow these instructions at home: Medicines  Follow your health care provider's instructions regarding medicine use. Specific medicines may be either safe or unsafe to take during pregnancy.  Take a prenatal vitamin that contains at least 600 micrograms (mcg) of folic acid.  If you develop constipation, try taking a  stool softener if your health care provider approves. Eating and drinking  Eat a balanced diet that includes fresh fruits and vegetables, whole grains, good sources of protein such as meat, eggs, or tofu, and low-fat dairy. Your health care provider will help you determine the amount of weight gain that is right for you.  Avoid raw meat and uncooked cheese. These carry germs that can cause birth defects in the baby.  Eating four or five small meals rather than three large meals a day may help relieve nausea and vomiting. If you start to feel nauseous, eating a few soda crackers can be helpful. Drinking liquids between meals, instead of during meals, also seems to help ease nausea and vomiting.  Limit foods that are high in fat and processed sugars, such as fried and sweet foods.  To prevent constipation: ? Eat foods that are high in fiber, such as fresh fruits and vegetables, whole grains, and beans. ? Drink enough fluid to keep your urine clear or pale yellow. Activity  Exercise only as directed by your health care provider. Most women can  continue their usual exercise routine during pregnancy. Try to exercise for 30 minutes at least 5 days a week. Exercising will help you: ? Control your weight. ? Stay in shape. ? Be prepared for labor and delivery.  Experiencing pain or cramping in the lower abdomen or lower back is a good sign that you should stop exercising. Check with your health care provider before continuing with normal exercises.  Try to avoid standing for long periods of time. Move your legs often if you must stand in one place for a long time.  Avoid heavy lifting.  Wear low-heeled shoes and practice good posture.  You may continue to have sex unless your health care provider tells you not to. Relieving pain and discomfort  Wear a good support bra to relieve breast tenderness.  Take warm sitz baths to soothe any pain or discomfort caused by hemorrhoids. Use hemorrhoid cream if your health care provider approves.  Rest with your legs elevated if you have leg cramps or low back pain.  If you develop varicose veins in your legs, wear support hose. Elevate your feet for 15 minutes, 3-4 times a day. Limit salt in your diet. Prenatal care  Schedule your prenatal visits by the twelfth week of pregnancy. They are usually scheduled monthly at first, then more often in the last 2 months before delivery.  Write down your questions. Take them to your prenatal visits.  Keep all your prenatal visits as told by your health care provider. This is important. Safety  Wear your seat belt at all times when driving.  Make a list of emergency phone numbers, including numbers for family, friends, the hospital, and police and fire departments. General instructions  Ask your health care provider for a referral to a local prenatal education class. Begin classes no later than the beginning of month 6 of your pregnancy.  Ask for help if you have counseling or nutritional needs during pregnancy. Your health care provider can offer  advice or refer you to specialists for help with various needs.  Do not use hot tubs, steam rooms, or saunas.  Do not douche or use tampons or scented sanitary pads.  Do not cross your legs for long periods of time.  Avoid cat litter boxes and soil used by cats. These carry germs that can cause birth defects in the baby and possibly loss of the fetus by  miscarriage or stillbirth.  Avoid all smoking, herbs, alcohol, and medicines not prescribed by your health care provider. Chemicals in these products affect the formation and growth of the baby.  Do not use any products that contain nicotine or tobacco, such as cigarettes and e-cigarettes. If you need help quitting, ask your health care provider. You may receive counseling support and other resources to help you quit.  Schedule a dentist appointment. At home, brush your teeth with a soft toothbrush and be gentle when you floss. Contact a health care provider if:  You have dizziness.  You have mild pelvic cramps, pelvic pressure, or nagging pain in the abdominal area.  You have persistent nausea, vomiting, or diarrhea.  You have a bad smelling vaginal discharge.  You have pain when you urinate.  You notice increased swelling in your face, hands, legs, or ankles.  You are exposed to fifth disease or chickenpox.  You are exposed to [place] measles (rubella) and have never had it. Get help right away if:  You have a fever.  You are leaking fluid from your vagina.  You have spotting or bleeding from your vagina.  You have severe abdominal cramping or pain.  You have rapid weight gain or loss.  You vomit blood or material that looks like coffee grounds.  You develop a severe headache.  You have shortness of breath.  You have any kind of trauma, such as from a fall or a car accident. Summary  The first trimester of pregnancy is from week 1 until the end of week 13 (months 1 through 3).  Your body goes through many  changes during pregnancy. The changes vary from woman to woman.  You will have routine prenatal visits. During those visits, your health care provider will examine you, discuss any test results you may have, and talk with you about how you are feeling. This information is not intended to replace advice given to you by your health care provider. Make sure you discuss any questions you have with your health care provider. Document Released: 06/04/2001 Document Revised: 05/22/2016 Document Reviewed: 05/22/2016 Elsevier Interactive Patient Education  2017 [employer].

## 2017-04-24 NOTE — MAU Provider Note (Signed)
History     CSN: [id number]  Arrival date and time: 04/24/17 1053   First Provider Initiated Contact with Patient 04/24/17 1143      Chief Complaint  Patient presents with  . Vaginal Bleeding   HPI Brenda Porter is a 31 y.o. [id number] at 2058w1d who presents with vaginal bleeding. She states she had intercourse last night and had a large amount of bleeding when she went to the bathroom this morning. Since then, the bleeding has been minimal, only a small amount of the tissue when she wipes. She reports intermittent abdominal cramping in the left lower quadrant that she rates a 3/10 and has not taken anything for the pain. She denies any abnormal vaginal discharge. She had an ultrasound on 10/16 with [id number] IUP and is starting prenatal care at the clinic next week.   OB History    Gravida Para Term Preterm AB Living   5 2 2   2 2    SAB TAB Ectopic Multiple Live Births   1   1   2       Past Medical History:  Diagnosis Date  . Bipolar 1 disorder, depressed (HCC)   . GERD (gastroesophageal reflux disease)   . Gestational diabetes   . Goiter   . UTI (urinary tract infection) in pregnancy in first trimester     Past Surgical History:  Procedure Laterality Date  . WISDOM TOOTH EXTRACTION      Family History  Problem Relation Age of Onset  . Hyperthyroidism Mother   . Hypertension Father   . Diabetes Father   . Blindness Father   . Hyperthyroidism Sister   . Cancer Maternal Grandmother   . Hyperthyroidism Maternal Grandmother   . Other Neg Hx     Social History  Substance Use Topics  . Smoking status: Current Every Day Smoker    Packs/day: 0.50    Years: 10.00    Types: Cigarettes  . Smokeless tobacco: Never Used  . Alcohol use No    Allergies:  Allergies  Allergen Reactions  . Codeine Nausea And Vomiting, Swelling and Rash  . Latex Itching and Rash  . Penicillins Rash    Prescriptions Prior to Admission  Medication Sig Dispense Refill Last Dose  .  acetaminophen (TYLENOL) 325 MG tablet Take 650 mg by mouth every 6 (six) hours as needed (pain).   04/23/2017 at Unknown time  . methocarbamol (ROBAXIN) 500 MG tablet Take 1 tablet (500 mg total) by mouth every 6 (six) hours as needed for muscle spasms. 20 tablet 0 More than a month at Unknown time  . metroNIDAZOLE (FLAGYL) 500 MG tablet Take 1 tablet (500 mg total) by mouth 2 (two) times daily. 14 tablet 0 More than a month at Unknown time  . promethazine (PHENERGAN) 12.5 MG tablet Take 1 tablet (12.5 mg total) by mouth every 6 (six) hours as needed for nausea or vomiting. 30 tablet 0 More than a month at Unknown time    Review of Systems  Constitutional: Negative.  Negative for fatigue and fever.  HENT: Negative.   Respiratory: Negative.  Negative for shortness of breath.   Cardiovascular: Negative.  Negative for chest pain.  Gastrointestinal: Positive for abdominal pain. Negative for constipation, diarrhea, nausea and vomiting.  Genitourinary: Positive for vaginal bleeding. Negative for dysuria.  Neurological: Negative.  Negative for dizziness and headaches.   Physical Exam   Blood pressure 125/86, pulse 85, temperature 98.4 F (36.9 C), resp. rate 18, weight 200 lb (  90.7 kg), last menstrual period 01/06/2017, unknown if currently breastfeeding.  Physical Exam  Nursing note and vitals reviewed. Constitutional: She is oriented to person, place, and time. She appears well-developed and well-nourished. No distress.  HENT:  Head: Normocephalic.  Eyes: Pupils are equal, round, and reactive to light.  Cardiovascular: Normal rate, regular rhythm and normal heart sounds.   Respiratory: Effort normal and breath sounds normal. No respiratory distress.  GI: Soft. Bowel sounds are normal. She exhibits no distension. There is no tenderness.  Genitourinary: Vaginal discharge (small amount of brown discharge) found.  Neurological: She is alert and oriented to person, place, and time.  Skin: Skin  is warm and dry.  Psychiatric: She has a normal mood and affect. Her behavior is normal. Judgment and thought content normal.   Pelvic exam: Cervix pink, visually closed, without lesion, vaginal walls and external genitalia normal Bimanual exam: Cervix 0/long/high, firm, anterior, neg CMT, uterus nontender, nonenlarged, adnexa without tenderness, enlargement, or mass  MAU Course  Procedures Results for orders placed or performed during the hospital encounter of 04/24/17 (from the past 24 hour(s))  Urinalysis, Routine w reflex microscopic     Status: Abnormal   Collection Time: 04/24/17 11:15 AM  Result Value Ref Range   Color, Urine YELLOW YELLOW   APPearance CLEAR CLEAR   Specific Gravity, Urine 1.018 1.005 - 1.030   pH 6.0 5.0 - 8.0   Glucose, UA NEGATIVE NEGATIVE mg/dL   Hgb urine dipstick MODERATE (A) NEGATIVE   Bilirubin Urine NEGATIVE NEGATIVE   Ketones, ur NEGATIVE NEGATIVE mg/dL   Protein, ur NEGATIVE NEGATIVE mg/dL   Nitrite NEGATIVE NEGATIVE   Leukocytes, UA NEGATIVE NEGATIVE   RBC / HPF 0-5 0 - 5 RBC/hpf   WBC, UA 0-5 0 - 5 WBC/hpf   Bacteria, UA RARE (A) NONE SEEN   Squamous Epithelial / LPF 0-5 (A) NONE SEEN   Mucus PRESENT   Wet prep, genital     Status: Abnormal   Collection Time: 04/24/17 11:49 AM  Result Value Ref Range   Yeast Wet Prep HPF POC NONE SEEN NONE SEEN   Trich, Wet Prep NONE SEEN NONE SEEN   Clue Cells Wet Prep HPF POC NONE SEEN NONE SEEN   WBC, Wet Prep HPF POC MODERATE (A) NONE SEEN   Sperm NONE SEEN     MDM UA Wet prep and gc/chlamydia Bedside u/s performed by M. Bhambri CNM- normal FHR and fetal movement noted Assessment and Plan   1. Vaginal bleeding in pregnancy, first trimester   2. Abdominal cramping affecting pregnancy   3. [redacted] weeks gestation of pregnancy    -Discharge home in stable condition -Bleeding precautions discussed -Encouraged tylenol and increase hydration as needed for pain -Patient advised to follow[facility]up with [facility] as scheduled on Monday 11/5 for prenatal care Patient may return to MAU as needed or if her condition were to change or worsen   Rolm Bookbinder CNM 04/24/2017, 12:39 PM

## 2017-04-24 NOTE — MAU Note (Signed)
Pt presents to MAU with complaints of vaginal bleeding this morning when she wipes, last intercourse last night. Mild lower abdominal cramping

## 2017-04-25 LAB — GC/CHLAMYDIA PROBE AMP (~~LOC~~) NOT AT ARMC
CHLAMYDIA, DNA PROBE: NEGATIVE
NEISSERIA GONORRHEA: NEGATIVE

## 2017-04-28 ENCOUNTER — Encounter: Payer: Self-pay | Admitting: Obstetrics & Gynecology

## 2017-05-06 ENCOUNTER — Encounter: Payer: Self-pay | Admitting: Medical

## 2017-06-05 ENCOUNTER — Encounter: Payer: Self-pay | Admitting: Obstetrics and Gynecology

## 2017-06-05 ENCOUNTER — Ambulatory Visit (INDEPENDENT_AMBULATORY_CARE_PROVIDER_SITE_OTHER): Payer: Medicaid Other | Admitting: Obstetrics and Gynecology

## 2017-06-05 ENCOUNTER — Other Ambulatory Visit: Payer: Self-pay

## 2017-06-05 ENCOUNTER — Other Ambulatory Visit (HOSPITAL_COMMUNITY)
Admission: RE | Admit: 2017-06-05 | Discharge: 2017-06-05 | Disposition: A | Discharge status: Home / Self Care | Payer: Medicaid Other | Source: Ambulatory Visit | Attending: Obstetrics and Gynecology | Admitting: Obstetrics and Gynecology

## 2017-06-05 VITALS — BP 109/57 | HR 62 | Wt 209.4 lb

## 2017-06-05 DIAGNOSIS — O99210 Obesity complicating pregnancy, unspecified trimester: Secondary | ICD-10-CM | POA: Insufficient documentation

## 2017-06-05 DIAGNOSIS — E049 Nontoxic goiter, unspecified: Secondary | ICD-10-CM

## 2017-06-05 DIAGNOSIS — Z3A00 Weeks of gestation of pregnancy not specified: Secondary | ICD-10-CM | POA: Insufficient documentation

## 2017-06-05 DIAGNOSIS — O0990 Supervision of high risk pregnancy, unspecified, unspecified trimester: Secondary | ICD-10-CM

## 2017-06-05 DIAGNOSIS — O09292 Supervision of pregnancy with other poor reproductive or obstetric history, second trimester: Secondary | ICD-10-CM | POA: Diagnosis not present

## 2017-06-05 DIAGNOSIS — Z8632 Personal history of gestational diabetes: Secondary | ICD-10-CM | POA: Insufficient documentation

## 2017-06-05 DIAGNOSIS — O0930 Supervision of pregnancy with insufficient antenatal care, unspecified trimester: Secondary | ICD-10-CM

## 2017-06-05 DIAGNOSIS — O09299 Supervision of pregnancy with other poor reproductive or obstetric history, unspecified trimester: Secondary | ICD-10-CM

## 2017-06-05 DIAGNOSIS — O99212 Obesity complicating pregnancy, second trimester: Secondary | ICD-10-CM

## 2017-06-05 DIAGNOSIS — E669 Obesity, unspecified: Secondary | ICD-10-CM | POA: Insufficient documentation

## 2017-06-05 DIAGNOSIS — O0992 Supervision of high risk pregnancy, unspecified, second trimester: Secondary | ICD-10-CM

## 2017-06-05 DIAGNOSIS — O0932 Supervision of pregnancy with insufficient antenatal care, second trimester: Secondary | ICD-10-CM | POA: Diagnosis not present

## 2017-06-05 DIAGNOSIS — N870 Mild cervical dysplasia: Secondary | ICD-10-CM | POA: Insufficient documentation

## 2017-06-05 LAB — POCT URINALYSIS DIP (DEVICE)
Bilirubin Urine: NEGATIVE
Glucose, UA: NEGATIVE mg/dL
HGB URINE DIPSTICK: NEGATIVE
KETONES UR: NEGATIVE mg/dL
Nitrite: NEGATIVE
PH: 7 (ref 5.0–8.0)
PROTEIN: NEGATIVE mg/dL
SPECIFIC GRAVITY, URINE: 1.02 (ref 1.005–1.030)
Urobilinogen, UA: 1 mg/dL (ref 0.0–1.0)

## 2017-06-05 MED ORDER — PRENATAL PLUS 27-1 MG PO TABS: 1.0000 | ORAL_TABLET | Freq: Every day | ORAL | 12 refills | Status: DC

## 2017-06-05 NOTE — Progress Notes (Signed)
New OB Note  06/05/2017   [facility]  Chief Complaint: NOB  Transfer of Care Patient: no  History of Present Illness: Ms. Chilton SiGreen is a 31 y.o. [id number] @ 15/1 weeks (EDC 6/5, based on 6wk u/s) Patient's last menstrual period was 01/06/2017.  Preg complicated by has History of gestational diabetes mellitus (GDM); History of shoulder dystocia in prior pregnancy, currently pregnant; Obesity (BMI 30-39.9); Obesity in pregnancy, antepartum; and Late prenatal care on their problem list.   Any events prior to today's visit: no She was using no method when she conceived.  She has Negative signs or symptoms of nausea/vomiting of pregnancy. She has Negative signs or symptoms of miscarriage or preterm labor On any medications around the time she conceived/early pregnancy: No   ROS: A 12-point review of systems was performed and negative, except as stated in the above HPI.  OBGYN History: As per HPI. OB History  Gravida Para Term Preterm AB Living  5 2 2   2 2   SAB TAB Ectopic Multiple Live Births  1   1   2     # Outcome Date GA Lbr Len/2nd Weight Sex Delivery Anes PTL Lv  5 Current           4 Term 02/12/12 2319w4d 02:24 / 00:23 8 lb 3 oz (3.714 kg) M Vag-Spont None  LIV     Complications: Gestational diabetes mellitus (GDM)  3 Term 02/03/10 2221w0d  6 lb 7 oz (2.92 kg) F Vag-Spont   LIV  2 SAB           1 Ectopic               Any issues with any prior pregnancies: yes, h/o GDM and SD with last delivery Prior children are healthy, doing well, and without any problems or issues: yes History of pap smears: Yes. Last pap smear unknown and results were never abnormal per patient History of STIs: No   Past Medical History: Past Medical History:  Diagnosis Date  . Bipolar 1 disorder, depressed (HCC)   . Depression   . GERD (gastroesophageal reflux disease)   . Gestational diabetes   . Goiter   . Shoulder dystocia 02/12/2012  . UTI (urinary tract infection) in  pregnancy in first trimester     Past Surgical History: Past Surgical History:  Procedure Laterality Date  . WISDOM TOOTH EXTRACTION      Family History:  Family History  Problem Relation Age of Onset  . Hyperthyroidism Mother   . Hypertension Father   . Diabetes Father   . Blindness Father   . Hyperthyroidism Sister   . Cancer Maternal Grandmother   . Hyperthyroidism Maternal Grandmother   . Other Neg Hx    She denies any female cancers, bleeding or blood clotting disorders.  She denies any history of mental retardation, birth defects or genetic disorders in her or the FOB's history  Social History:  Social History   Socioeconomic History  . Marital status: Single    Spouse name: Not on file  . Number of children: Not on file  . Years of education: Not on file  . Highest education level: Not on file  Social Needs  . Financial resource strain: Not on file  . Food insecurity - worry: Not on file  . Food insecurity - inability: Not on file  . Transportation needs - medical: Not on file  . Transportation needs - non-medical: Not on file  Occupational History  .  Not on file  Tobacco Use  . Smoking status: Current Every Day Smoker    Packs/day: 0.50    Years: 10.00    Pack years: 5.00    Types: Cigarettes  . Smokeless tobacco: Never Used  Substance and Sexual Activity  . Alcohol use: No  . Drug use: Yes    Types: Marijuana    Comment: Daily  . Sexual activity: Yes    Birth control/protection: None, Condom  Other Topics Concern  . Not on file  Social History Narrative  . Not on file  Denies drug or tobacco use  Allergy: Allergies  Allergen Reactions  . Codeine Nausea And Vomiting, Swelling and Rash  . Latex Itching and Rash  . Penicillins Rash    Health Maintenance:  Mammogram Up to Date: not applicable  Current Outpatient Medications: None   Physical Exam:   BP (!) 109/57   Pulse 62   Wt 209 lb 6.4 oz (95 kg)   LMP 01/06/2017   BMI 34.85  kg/m  Body mass index is 34.85 kg/m.   Vag. Bleeding: None. Fundal height: not applicable FHTs: 140s  General appearance: Well nourished, well developed female in no acute distress.  Neck:  Supple, normal appearance. Diffuse anterior fullness noted, nttp Cardiovascular: S1, S2 normal, no murmur, rub or gallop, regular rate and rhythm Respiratory:  Clear to auscultation bilateral. Normal respiratory effort Abdomen: positive bowel sounds and no masses, hernias; diffusely non tender to palpation, non distended Breasts: patient denies any breast s/s Neuro/Psych:  Normal mood and affect.  Skin:  Warm and dry.  Lymphatic:  No inguinal lymphadenopathy.   Pelvic exam: is not limited by body habitus EGBUS: within normal limits, Vagina: within normal limits and with no blood in the vault, Cervix: normal appearing cervix without discharge or lesions, closed/long/high, Uterus:  enlarged, c/w 14-16 week size, and Adnexa:  normal adnexa and no mass, fullness, tenderness  Laboratory: none  Imaging:  CLINICAL DATA:  First trimester pregnancy with inconclusive fetal viability.  EXAM: TRANSVAGINAL OB ULTRASOUND  TECHNIQUE: Transvaginal ultrasound was performed for complete evaluation of the gestation as well as the maternal uterus, adnexal regions, and pelvic cul-de-sac.  COMPARISON:  03/25/2017  FINDINGS: Intrauterine gestational sac: Single  Yolk sac:  Visualized.  Embryo:  Visualized.  Cardiac Activity: Visualized.  Heart Rate: 123 bpm  CRL:   9  mm   6 w 6 d                  [place] EDC: 11/26/2016  Subchorionic hemorrhage:  None visualized.  Maternal uterus/adnexae: Normal appearance of both ovaries. No mass or free fluid identified.  IMPRESSION: Single living IUP measuring 6 weeks 6 days, with [place] EDC of 11/26/2016.  No significant maternal uterine or adnexal abnormality identified.   Electronically Signed   By: Myles Rosenthal M.D.   On: 04/08/2017  13:35  Assessment: pt doing well  Plan: 1. Supervision of high risk pregnancy, antepartum Routine care. Pt amenable to genetics.  - Obstetric Panel, Including HIV - [place] MFM OB DETAIL +14 WK; Future - Culture, OB Urine - Glucose tolerance, 1 hour - AFP TETRA - SMN1 Copy Number Analysis - Hemoglobinopathy evaluation - Cystic fibrosis gene test - Cytology - PAP - US THYROID; Future - TSH - T4, free  2. History of gestational diabetes mellitus (GDM) Will do formal 1hr today  3. History of shoulder dystocia in prior pregnancy, currently pregnant Not noted in delivery note but "mild" noted in  d/c summary; weight was 3714gm. Pt states that they RNs had to push on her belly and the infant easily came out and that he never had any deficits or issues from this. D/w her that she is ate risk for another one with this pregnancy. [facility] and then counsel her further on pros and cons of trying for VD vs c-section.   4. Obesity in pregnancy, antepartum Baseline pre-x labs nv (I forgot to order this time)  5. Late prenatal care  6. Goiter Pt states sometimes she has trouble swallowing but no issue with breathing. States she had a work up many years back but not sure of follow up. Last u/s seen in 2007. Will get below studies.  - US THYROID; Future - TSH - T4, free  Problem list reviewed and updated.  Follow up in 3 weeks.  The nature of [facility] with multiple MDs and other Advanced Practice Providers was explained to patient; also emphasized that residents, students are part of our team.  >50% of 25 min visit spent on counseling and coordination of care.     Cornelia Copaharlie Eleanor Gatliff, Jr. MD Attending [facility](Faculty Practice)

## 2017-06-06 ENCOUNTER — Encounter: Payer: Self-pay | Admitting: Obstetrics and Gynecology

## 2017-06-06 DIAGNOSIS — E049 Nontoxic goiter, unspecified: Secondary | ICD-10-CM | POA: Insufficient documentation

## 2017-06-07 LAB — URINE CULTURE, OB REFLEX

## 2017-06-07 LAB — CULTURE, OB URINE

## 2017-06-09 LAB — CYTOLOGY - PAP
Chlamydia: NEGATIVE
Neisseria Gonorrhea: NEGATIVE
Trichomonas: NEGATIVE

## 2017-06-10 ENCOUNTER — Ambulatory Visit (HOSPITAL_COMMUNITY): Payer: Medicaid Other

## 2017-06-12 ENCOUNTER — Encounter: Payer: Self-pay | Admitting: Family Medicine

## 2017-06-12 DIAGNOSIS — O0990 Supervision of high risk pregnancy, unspecified, unspecified trimester: Secondary | ICD-10-CM | POA: Insufficient documentation

## 2017-06-12 LAB — HEMOGLOBINOPATHY EVALUATION
HEMOGLOBIN F QUANTITATION: 0 % (ref 0.0–2.0)
HGB A: 97.7 % (ref 96.4–98.8)
HGB C: 0 %
HGB S: 0 %
HGB VARIANT: 0 %
Hemoglobin A2 Quantitation: 2.3 % (ref 1.8–3.2)

## 2017-06-12 LAB — SMN1 COPY NUMBER ANALYSIS (SMA CARRIER SCREENING)

## 2017-06-12 LAB — AFP TETRA
DIA MOM VALUE: 1.25
DIA VALUE (EIA): 189.18 pg/mL
DSR (By Age)    1 IN: 716
DSR (Second Trimester) 1 IN: 5724
Gestational Age: 15.1 WEEKS
MATERNAL AGE AT EDD: 29.8 a
MSAFP Mom: 1.63
MSAFP: 35.2 ng/mL
MSHCG MOM: 0.76
MSHCG: 31677 m[IU]/mL
OSB RISK: 1944
T18 (By Age): 1:2788 {titer}
TEST RESULTS AFP: NEGATIVE
UE3 VALUE: 0.5 ng/mL
Weight: 209 [lb_av]
uE3 Mom: 0.95

## 2017-06-12 LAB — OBSTETRIC PANEL, INCLUDING HIV
Antibody Screen: NEGATIVE
BASOS ABS: 0 10*3/uL (ref 0.0–0.2)
Basos: 0 %
EOS (ABSOLUTE): 0.2 10*3/uL (ref 0.0–0.4)
Eos: 1 %
HEP B S AG: NEGATIVE
HIV SCREEN 4TH GENERATION: NONREACTIVE
Hematocrit: 33.5 % — ABNORMAL LOW (ref 34.0–46.6)
Hemoglobin: 11.3 g/dL (ref 11.1–15.9)
IMMATURE GRANULOCYTES: 0 %
Immature Grans (Abs): 0 10*3/uL (ref 0.0–0.1)
LYMPHS ABS: 4.8 10*3/uL — AB (ref 0.7–3.1)
Lymphs: 28 %
MCH: 30.8 pg (ref 26.6–33.0)
MCHC: 33.7 g/dL (ref 31.5–35.7)
MCV: 91 fL (ref 79–97)
MONOS ABS: 1 10*3/uL — AB (ref 0.1–0.9)
Monocytes: 6 %
NEUTROS ABS: 11 10*3/uL — AB (ref 1.4–7.0)
NEUTROS PCT: 65 %
PLATELETS: 248 10*3/uL (ref 150–379)
RBC: 3.67 x10E6/uL — AB (ref 3.77–5.28)
RDW: 13.1 % (ref 12.3–15.4)
RPR: NONREACTIVE
Rh Factor: POSITIVE
Rubella Antibodies, IGG: 5.92 index (ref >0.99)
WBC: 17.1 10*3/uL — AB (ref 3.4–10.8)

## 2017-06-12 LAB — GLUCOSE TOLERANCE, 1 HOUR: Glucose, 1Hr PP: 87 mg/dL (ref 65–199)

## 2017-06-12 LAB — CYSTIC FIBROSIS GENE TEST

## 2017-06-12 LAB — TSH: TSH: 1.82 u[IU]/mL (ref 0.450–4.500)

## 2017-06-12 LAB — T4, FREE: Free T4: 1.22 ng/dL (ref 0.82–1.77)

## 2017-06-13 ENCOUNTER — Ambulatory Visit (HOSPITAL_COMMUNITY): Admission: RE | Admit: 2017-06-13 | Payer: Medicaid Other | Source: Ambulatory Visit

## 2017-06-19 ENCOUNTER — Encounter: Payer: Self-pay | Admitting: Obstetrics and Gynecology

## 2017-06-19 DIAGNOSIS — R87612 Low grade squamous intraepithelial lesion on cytologic smear of cervix (LGSIL): Secondary | ICD-10-CM | POA: Insufficient documentation

## 2017-06-24 NOTE — L&D Delivery Note (Signed)
Patient: Brenda HammanKathleen N Porter MRN: [id number]  GBS status: negative  Patient is a 32 y.o. now [id number] s/p NSVD at 7480w5d, who was admitted for SOL. AROM 2h 1717m prior to delivery with clear fluid.   Delivery Note At 1:58 PM a viable female was delivered via Vaginal, Spontaneous (Presentation: vertex; ROA).  APGAR: 8, 9; weight pending.   Placenta status: intact.  Cord: 3-vessle  Anesthesia:  Epidural Episiotomy: None Lacerations: None Suture Repair: none Est. Blood Loss (mL): 100  Mom to postpartum.  Baby to Couplet care / Skin to Skin.  Raynelle FanningJulie P. Mayerly Kaman, MD OB Fellow 11/24/17, 2:31 PM

## 2017-06-25 ENCOUNTER — Telehealth: Payer: Self-pay | Admitting: General Practice

## 2017-06-25 NOTE — Telephone Encounter (Signed)
Called patient, no answer- left message to call us concerning results

## 2017-06-25 NOTE — Telephone Encounter (Signed)
-----   Message from [place] Bingharlie Pickens, MD sent at 06/19/2017  2:43 PM EST ----- Please let her know that her pap was slightly abnormal and she needs a colposcopy and set her up for that. thanks

## 2017-06-26 NOTE — Telephone Encounter (Signed)
Received message left on nurse voicemail on 06/25/17 at 1634.  States she is returning our call for test results.  Requests a return call to [phone].

## 2017-06-27 NOTE — Telephone Encounter (Signed)
Called pt and informed her of abnormal Pap which requires follow up with Colposcopy procedure. procedure briefly explained. This may be able to be completed @ her next prenatal visit on 1/11 or may need to be scheduled on a different day.  She will be given addition al information @ visit on 1/11.  Pt voiced understanding and had no questions.

## 2017-06-30 ENCOUNTER — Encounter (HOSPITAL_COMMUNITY): Payer: Self-pay | Admitting: Obstetrics and Gynecology

## 2017-07-02 ENCOUNTER — Ambulatory Visit (HOSPITAL_COMMUNITY)
Admission: RE | Admit: 2017-07-02 | Discharge: 2017-07-02 | Disposition: A | Discharge status: Home / Self Care | Payer: Medicaid Other | Source: Ambulatory Visit | Attending: Obstetrics and Gynecology | Admitting: Obstetrics and Gynecology

## 2017-07-02 ENCOUNTER — Other Ambulatory Visit: Payer: Self-pay | Admitting: Obstetrics and Gynecology

## 2017-07-02 DIAGNOSIS — E669 Obesity, unspecified: Secondary | ICD-10-CM | POA: Insufficient documentation

## 2017-07-02 DIAGNOSIS — Z3689 Encounter for other specified antenatal screening: Secondary | ICD-10-CM | POA: Diagnosis present

## 2017-07-02 DIAGNOSIS — O321XX0 Maternal care for breech presentation, not applicable or unspecified: Secondary | ICD-10-CM | POA: Diagnosis not present

## 2017-07-02 DIAGNOSIS — O0990 Supervision of high risk pregnancy, unspecified, unspecified trimester: Secondary | ICD-10-CM

## 2017-07-02 DIAGNOSIS — O09299 Supervision of pregnancy with other poor reproductive or obstetric history, unspecified trimester: Secondary | ICD-10-CM

## 2017-07-02 DIAGNOSIS — O99212 Obesity complicating pregnancy, second trimester: Secondary | ICD-10-CM

## 2017-07-02 DIAGNOSIS — Z8632 Personal history of gestational diabetes: Secondary | ICD-10-CM | POA: Diagnosis not present

## 2017-07-02 DIAGNOSIS — Z3A19 19 weeks gestation of pregnancy: Secondary | ICD-10-CM | POA: Diagnosis not present

## 2017-07-02 DIAGNOSIS — O0992 Supervision of high risk pregnancy, unspecified, second trimester: Secondary | ICD-10-CM | POA: Diagnosis not present

## 2017-07-03 ENCOUNTER — Encounter: Payer: Medicaid Other | Admitting: Student

## 2017-07-03 LAB — COMPREHENSIVE METABOLIC PANEL
A/G RATIO: 1.4 (ref 1.2–2.2)
ALBUMIN: 4 g/dL (ref 3.5–5.5)
ALT: 11 IU/L (ref 0–32)
AST: 7 IU/L (ref 0–40)
Alkaline Phosphatase: 50 IU/L (ref 39–117)
BUN/Creatinine Ratio: 10 (ref 9–23)
BUN: 7 mg/dL (ref 6–20)
Bilirubin Total: <0.2 mg/dL (ref 0.0–1.2)
CALCIUM: 9.5 mg/dL (ref 8.7–10.2)
CO2: 18 mmol/L — AB (ref 20–29)
CREATININE: 0.67 mg/dL (ref 0.57–1.00)
Chloride: 104 mmol/L (ref 96–106)
GFR calc Af Amer: 137 mL/min/{1.73_m2} (ref >59)
GFR, EST NON AFRICAN AMERICAN: 119 mL/min/{1.73_m2} (ref >59)
GLOBULIN, TOTAL: 2.8 g/dL (ref 1.5–4.5)
Glucose: 85 mg/dL (ref 65–99)
POTASSIUM: 4 mmol/L (ref 3.5–5.2)
SODIUM: 140 mmol/L (ref 134–144)
Total Protein: 6.8 g/dL (ref 6.0–8.5)

## 2017-07-03 LAB — SPECIMEN STATUS REPORT

## 2017-07-14 ENCOUNTER — Encounter: Payer: Medicaid Other | Admitting: Obstetrics & Gynecology

## 2017-07-15 ENCOUNTER — Other Ambulatory Visit (HOSPITAL_COMMUNITY)
Admission: RE | Admit: 2017-07-15 | Discharge: 2017-07-15 | Disposition: A | Discharge status: Home / Self Care | Payer: Medicaid Other | Source: Ambulatory Visit | Attending: Obstetrics & Gynecology | Admitting: Obstetrics & Gynecology

## 2017-07-15 ENCOUNTER — Encounter: Payer: Self-pay | Admitting: *Deleted

## 2017-07-15 ENCOUNTER — Ambulatory Visit (INDEPENDENT_AMBULATORY_CARE_PROVIDER_SITE_OTHER): Payer: Medicaid Other | Admitting: Obstetrics & Gynecology

## 2017-07-15 VITALS — BP 107/59 | HR 72 | Wt 219.4 lb

## 2017-07-15 DIAGNOSIS — N760 Acute vaginitis: Secondary | ICD-10-CM | POA: Insufficient documentation

## 2017-07-15 DIAGNOSIS — B9689 Other specified bacterial agents as the cause of diseases classified elsewhere: Secondary | ICD-10-CM | POA: Insufficient documentation

## 2017-07-15 DIAGNOSIS — O09299 Supervision of pregnancy with other poor reproductive or obstetric history, unspecified trimester: Secondary | ICD-10-CM

## 2017-07-15 DIAGNOSIS — O99332 Smoking (tobacco) complicating pregnancy, second trimester: Secondary | ICD-10-CM

## 2017-07-15 DIAGNOSIS — E049 Nontoxic goiter, unspecified: Secondary | ICD-10-CM

## 2017-07-15 DIAGNOSIS — R87612 Low grade squamous intraepithelial lesion on cytologic smear of cervix (LGSIL): Secondary | ICD-10-CM

## 2017-07-15 DIAGNOSIS — N898 Other specified noninflammatory disorders of vagina: Secondary | ICD-10-CM

## 2017-07-15 DIAGNOSIS — O99330 Smoking (tobacco) complicating pregnancy, unspecified trimester: Secondary | ICD-10-CM

## 2017-07-15 DIAGNOSIS — E669 Obesity, unspecified: Secondary | ICD-10-CM

## 2017-07-15 DIAGNOSIS — O0990 Supervision of high risk pregnancy, unspecified, unspecified trimester: Secondary | ICD-10-CM

## 2017-07-15 DIAGNOSIS — O0992 Supervision of high risk pregnancy, unspecified, second trimester: Secondary | ICD-10-CM

## 2017-07-15 DIAGNOSIS — Z8632 Personal history of gestational diabetes: Secondary | ICD-10-CM | POA: Diagnosis not present

## 2017-07-15 DIAGNOSIS — O99210 Obesity complicating pregnancy, unspecified trimester: Secondary | ICD-10-CM

## 2017-07-15 DIAGNOSIS — O0932 Supervision of pregnancy with insufficient antenatal care, second trimester: Secondary | ICD-10-CM | POA: Diagnosis not present

## 2017-07-15 DIAGNOSIS — O09292 Supervision of pregnancy with other poor reproductive or obstetric history, second trimester: Secondary | ICD-10-CM

## 2017-07-15 DIAGNOSIS — O99212 Obesity complicating pregnancy, second trimester: Secondary | ICD-10-CM | POA: Diagnosis not present

## 2017-07-15 DIAGNOSIS — O0930 Supervision of pregnancy with insufficient antenatal care, unspecified trimester: Secondary | ICD-10-CM

## 2017-07-15 NOTE — Progress Notes (Signed)
   PRENATAL VISIT NOTE  Subjective:  Brenda Porter is a 32 y.o. [id number] at 7822w6d being seen today for ongoing prenatal care.  She is currently monitored for the following issues for this low-risk pregnancy and has History of gestational diabetes mellitus (GDM); History of shoulder dystocia in prior pregnancy, currently pregnant; Obesity (BMI 30-39.9); Obesity in pregnancy, antepartum; Late prenatal care; Goiter; Supervision of high risk pregnancy, antepartum; and Low grade squamous intraepith lesion on cytologic smear cervix (lgsil) on their problem list.  Patient reports vaginal discharge with odor. showers dont help. .   Brenda Porter. Vag. Bleeding: None.  Movement: Present. Denies leaking of fluid.   The following portions of the patient's history were reviewed and updated as appropriate: allergies, current medications, past family history, past medical history, past social history, past surgical history and problem list. Problem list updated.  Objective:   Vitals:   07/15/17 1033  BP: (!) 107/59  Pulse: 72  Weight: 219 lb 6.4 oz (99.5 kg)    Fetal Status: Fetal Heart Rate (bpm): 158   Movement: Present     General:  Alert, oriented and cooperative. Patient is in no acute distress.  Skin: Skin is warm and dry. No rash noted.   Cardiovascular: Normal heart rate noted  Respiratory: Normal respiratory effort, no problems with respiration noted  Abdomen: Soft, gravid, appropriate for gestational age.  Pain/Pressure: Present     Pelvic: Cervical exam performed        Extremities: Normal range of motion.  Edema: None  Mental Status:  Normal mood and affect. Normal behavior. Normal judgment and thought content.   Assessment and Plan:  Pregnancy: [id number] at 2922w6d  1. Supervision of high risk pregnancy, antepartum  2. Vaginal discharge - Cervicovaginal ancillary only  3. Obesity in pregnancy, antepartum  4. Obesity (BMI 30-39.9)  5. Low grade squamous intraepith lesion on cytologic smear  cervix (lgsil) S/p colpo see below  6. Late prenatal care  7. History of shoulder dystocia in prior pregnancy, currently pregnant  8. History of gestational diabetes mellitus (GDM)  9. Goiter  Preterm labor symptoms and general obstetric precautions including but not limited to vaginal bleeding, contractions, leaking of fluid and fetal movement were reviewed in detail with the patient. Please refer to After Visit Summary for other counseling recommendations.   Patient given informed consent, signed copy in the chart, time out was performed.  Placed in lithotomy position. Cervix viewed with speculum and colposcope after application of acetic acid.   Colposcopy adequate?  yes Acetowhite lesions? Yes at 7 o' clock  Punctation? No  Mosaicism?  No Abnormal vasculature?  no Biopsies? no ECC? no  Patient was given post procedure instructions.   Return in about 4 weeks (around 08/12/2017).  Jacara Benito L. Erin FullingHarraway-Smith, M.D., [facility]      Brenda Rosenthalarolyn Harraway-Smith, MD

## 2017-07-16 LAB — CERVICOVAGINAL ANCILLARY ONLY
BACTERIAL VAGINITIS: POSITIVE — AB
Candida vaginitis: NEGATIVE
Chlamydia: NEGATIVE
Neisseria Gonorrhea: NEGATIVE
TRICH (WINDOWPATH): NEGATIVE

## 2017-07-24 ENCOUNTER — Telehealth: Payer: Self-pay | Admitting: General Practice

## 2017-07-24 DIAGNOSIS — B9689 Other specified bacterial agents as the cause of diseases classified elsewhere: Secondary | ICD-10-CM

## 2017-07-24 DIAGNOSIS — N760 Acute vaginitis: Principal | ICD-10-CM

## 2017-07-24 MED ORDER — METRONIDAZOLE 500 MG PO TABS
500.0000 mg | ORAL_TABLET | Freq: Two times a day (BID) | ORAL | 0 refills | Status: DC
Start: 2017-07-24 — End: 2017-10-10

## 2017-07-24 NOTE — Telephone Encounter (Signed)
Per chart review, patient has BV. Med ordered per protocol. Called patient, no answer- left message stating we are trying to reach you with results, please call us back

## 2017-07-24 NOTE — Telephone Encounter (Signed)
Patient called and left message stating she is returning our phone call, please call back. Called patient & informed her of BV and med sent to pharmacy. Patient verbalized understanding and had no questions

## 2017-08-13 ENCOUNTER — Encounter: Payer: Medicaid Other | Admitting: Advanced Practice Midwife

## 2017-09-04 ENCOUNTER — Encounter: Payer: Medicaid Other | Admitting: Student

## 2017-09-09 ENCOUNTER — Encounter: Payer: Medicaid Other | Admitting: Family Medicine

## 2017-10-10 ENCOUNTER — Encounter (HOSPITAL_COMMUNITY): Payer: Self-pay | Admitting: *Deleted

## 2017-10-10 ENCOUNTER — Inpatient Hospital Stay (HOSPITAL_COMMUNITY)
Admission: AD | Admit: 2017-10-10 | Discharge: 2017-10-10 | Disposition: A | Discharge status: Home / Self Care | Payer: Medicaid Other | Source: Ambulatory Visit | Attending: Family Medicine | Admitting: Family Medicine

## 2017-10-10 DIAGNOSIS — O2323 Infections of urethra in pregnancy, third trimester: Secondary | ICD-10-CM | POA: Insufficient documentation

## 2017-10-10 DIAGNOSIS — Z880 Allergy status to penicillin: Secondary | ICD-10-CM | POA: Diagnosis not present

## 2017-10-10 DIAGNOSIS — O99333 Smoking (tobacco) complicating pregnancy, third trimester: Secondary | ICD-10-CM | POA: Insufficient documentation

## 2017-10-10 DIAGNOSIS — O9989 Other specified diseases and conditions complicating pregnancy, childbirth and the puerperium: Secondary | ICD-10-CM | POA: Diagnosis not present

## 2017-10-10 DIAGNOSIS — N340 Urethral abscess: Secondary | ICD-10-CM | POA: Diagnosis not present

## 2017-10-10 DIAGNOSIS — M62838 Other muscle spasm: Secondary | ICD-10-CM | POA: Diagnosis not present

## 2017-10-10 DIAGNOSIS — M62830 Muscle spasm of back: Secondary | ICD-10-CM | POA: Diagnosis not present

## 2017-10-10 DIAGNOSIS — O26893 Other specified pregnancy related conditions, third trimester: Secondary | ICD-10-CM | POA: Diagnosis not present

## 2017-10-10 DIAGNOSIS — Z3A33 33 weeks gestation of pregnancy: Secondary | ICD-10-CM | POA: Diagnosis not present

## 2017-10-10 DIAGNOSIS — F17210 Nicotine dependence, cigarettes, uncomplicated: Secondary | ICD-10-CM | POA: Insufficient documentation

## 2017-10-10 DIAGNOSIS — M549 Dorsalgia, unspecified: Secondary | ICD-10-CM | POA: Diagnosis present

## 2017-10-10 DIAGNOSIS — Z331 Pregnant state, incidental: Secondary | ICD-10-CM | POA: Diagnosis not present

## 2017-10-10 LAB — RAPID URINE DRUG SCREEN, HOSP PERFORMED
AMPHETAMINES: NOT DETECTED
BENZODIAZEPINES: NOT DETECTED
Barbiturates: NOT DETECTED
COCAINE: POSITIVE — AB
Opiates: NOT DETECTED
TETRAHYDROCANNABINOL: POSITIVE — AB

## 2017-10-10 LAB — URINALYSIS, ROUTINE W REFLEX MICROSCOPIC
Bilirubin Urine: NEGATIVE
Glucose, UA: NEGATIVE mg/dL
Hgb urine dipstick: NEGATIVE
Ketones, ur: NEGATIVE mg/dL
NITRITE: NEGATIVE
PROTEIN: NEGATIVE mg/dL
SPECIFIC GRAVITY, URINE: 1.016 (ref 1.005–1.030)
pH: 7 (ref 5.0–8.0)

## 2017-10-10 LAB — WET PREP, GENITAL
Clue Cells Wet Prep HPF POC: NONE SEEN
SPERM: NONE SEEN
Trich, Wet Prep: NONE SEEN
Yeast Wet Prep HPF POC: NONE SEEN

## 2017-10-10 MED ORDER — CYCLOBENZAPRINE HCL 10 MG PO TABS: 10.0000 mg | ORAL_TABLET | Freq: Once | ORAL | 0 refills | Status: DC

## 2017-10-10 MED ORDER — SULFAMETHOXAZOLE-TRIMETHOPRIM 800-160 MG PO TABS: 1.0000 | ORAL_TABLET | Freq: Two times a day (BID) | ORAL | 0 refills | Status: AC

## 2017-10-10 MED ORDER — CYCLOBENZAPRINE HCL 10 MG PO TABS
10.0000 mg | ORAL_TABLET | Freq: Once | ORAL | Status: AC
  Administered 2017-10-10: 10 mg via ORAL
  Filled 2017-10-10: qty 1

## 2017-10-10 MED ORDER — CYCLOBENZAPRINE HCL 10 MG PO TABS: 10.0000 mg | ORAL_TABLET | Freq: Three times a day (TID) | ORAL | 0 refills | Status: DC | PRN

## 2017-10-10 NOTE — MAU Provider Note (Addendum)
History     CSN: [id number]  Arrival date and time: 10/10/17 1508   First Provider Initiated Contact with Patient 10/10/17 1544      Chief Complaint  Patient presents with  . Back Pain  . Abdominal Pain   HPI Patient 32yo [id number] at 4498w2d. Started having back pain earlier today. She smoked a joint that was laced with cocaine around 11:30 - she found out after smoking half of it, then stopped. She went inside and laid down, because she was upset. When she got up an hour later, her back pain started. No radiation. Mostly in middle of lower back.  Additionally, has pain in the upper vagina with foul smell. Started a week ago, but has been getting worse. Difficult to sit.  OB History    Gravida  5   Para  2   Term  2   Preterm      AB  2   Living  2     SAB  1   TAB      Ectopic  1   Multiple      Live Births  2           Past Medical History:  Diagnosis Date  . Bipolar 1 disorder, depressed (HCC)   . Depression   . GERD (gastroesophageal reflux disease)   . Gestational diabetes   . Goiter   . Shoulder dystocia 02/12/2012  . UTI (urinary tract infection) in pregnancy in first trimester     Past Surgical History:  Procedure Laterality Date  . WISDOM TOOTH EXTRACTION      Family History  Problem Relation Age of Onset  . Hyperthyroidism Mother   . Hypertension Father   . Diabetes Father   . Blindness Father   . Hyperthyroidism Sister   . Cancer Maternal Grandmother   . Hyperthyroidism Maternal Grandmother   . Other Neg Hx     Social History   Tobacco Use  . Smoking status: Current Every Day Smoker    Packs/day: 0.50    Years: 10.00    Pack years: 5.00    Types: Cigarettes  . Smokeless tobacco: Never Used  Substance Use Topics  . Alcohol use: No  . Drug use: Yes    Types: Marijuana    Comment: marijuana 10-10-17    Allergies:  Allergies  Allergen Reactions  . Codeine Nausea And Vomiting, Swelling and Rash  . Latex Itching and Rash   . Penicillins Rash    Has patient had a PCN reaction causing immediate rash, facial/tongue/throat swelling, SOB or lightheadedness with hypotension: No Has patient had a PCN reaction causing severe rash involving mucus membranes or skin necrosis: No Has patient had a PCN reaction that required hospitalization: No Has patient had a PCN reaction occurring within the last 10 years: No If all of the above answers are "NO", then may proceed with Cephalosporin use.     Medications Prior to Admission  Medication Sig Dispense Refill Last Dose  . acetaminophen (TYLENOL) 325 MG tablet Take 650 mg by mouth every 6 (six) hours as needed (pain).   10/10/2017 at Unknown time  . metroNIDAZOLE (FLAGYL) 500 MG tablet Take 1 tablet (500 mg total) by mouth 2 (two) times daily. (Patient not taking: Reported on 10/10/2017) 14 tablet 0 Completed Course at Unknown time  . nitrofurantoin, macrocrystal-monohydrate, (MACROBID) 100 MG capsule Take 100 mg by mouth 2 (two) times daily.   Completed Course at Unknown time  . prenatal vitamin  w/FE, FA (PRENATAL 1 + 1) 27-1 MG TABS tablet Take 1 tablet by mouth daily at 12 noon. 30 each 12 10/08/2017  . promethazine (PHENERGAN) 12.5 MG tablet Take 1 tablet (12.5 mg total) by mouth every 6 (six) hours as needed for nausea or vomiting. (Patient not taking: Reported on 06/05/2017) 30 tablet 0 Not Taking    Review of Systems Physical Exam   Blood pressure 126/60, pulse 77, temperature 98.4 F (36.9 C), temperature source Oral, resp. rate (!) 24, weight 217 lb 1.9 oz (98.5 kg), last menstrual period 01/06/2017, unknown if currently breastfeeding.  Physical Exam  Constitutional: She is oriented to person, place, and time. She appears well-developed and well-nourished.  HENT:  Head: Normocephalic and atraumatic.  Right Ear: External ear normal.  Left Ear: External ear normal.  Cardiovascular: Normal rate.  Respiratory: Effort normal.  GI: Soft. She exhibits no distension.  There is no tenderness. There is no rebound and no guarding.  Genitourinary:  Genitourinary Comments: 4cm fluctuant left skene gland abscess.  Musculoskeletal:  Tenderness to palpation of lumbar paraspinals. Muscle spasms on both sides.  Neurological: She is alert and oriented to person, place, and time.  Skin: Skin is warm and dry.  Psychiatric: She has a normal mood and affect. Her behavior is normal. Judgment and thought content normal.     Media Information         Document Information   Photos    10/10/2017 16:57  Attached To:  Hospital Encounter on 10/10/17   Source Information   Levie Heritage, DO  [facility]        Dilation: Closed Effacement (%): Thick Exam by:: Dr. Adrian Blackwater   MAU Course  Procedures NST: Reactive category 1 tracing. No contractions.  Skene's Gland abscess I&D: After consent, area cleaned with betadyne. Injected with lidocaine 1mL. Small incision made with scalpel. Silver nitrite for bleeding.   MDM   Assessment and Plan  1. Muscle spasm Flexeril Rest Ice/heat  2. Skene's Gland Abscess I&D Bactrim 1 DS BID x7 days F/u if bleeding increases.  3. [redacted]wk Gestation F/u in office  Levie Heritage 10/10/2017, 4:43 PM

## 2017-10-10 NOTE — MAU Note (Signed)
Pt C/O sudden onset of severe back pain around 1200 today, was lying in bed, tried to get up, was having intense pain.  Also C/O side & lower abd pain which started around the same.  Denies bleeding or LOF.  Reports good fetal movement.  Is having vaginal discharge with an odor, states "something's down there in my vagina."

## 2017-10-10 NOTE — Discharge Instructions (Signed)
Bartholin Cyst or Abscess A Bartholin cyst is a fluid-filled sac that forms on a Bartholin gland. Bartholin glands are small glands that are found in the folds of skin (labia) on the sides of the lower opening of the vagina. This type of cyst causes a bulge on the side of the vagina. A cyst that is not large or infected may not cause problems. However, if the fluid in the cyst becomes infected, the cyst can turn into an abscess. An abscess may cause discomfort or pain. Follow these instructions at home:  Take medicines only as told by your doctor.  If you were prescribed an antibiotic medicine, finish all of it even if you start to feel better.  Apply warm, wet compresses to the area or take warm, shallow baths that cover your pelvic area (sitz baths). Do this many times each day or as told by your doctor.  Do not squeeze the cyst. Do not apply heavy pressure to it.  Do not have sex until the cyst has gone away.  If your cyst or abscess was opened by your doctor, a small piece of gauze or a drain may have been placed in the area. That lets the cyst drain. Do not remove the gauze or the drain until your doctor tells you it is okay to do that.  Do not wear tampons. Wear feminine pads as needed for any fluid or blood.  Keep all follow-up visits as told by your doctor. This is important. Contact a doctor if:  Your pain, puffiness (swelling), or redness in the area of the cyst gets worse.  You have fluid or pus pus coming from the cyst.  You have a fever. This information is not intended to replace advice given to you by your health care provider. Make sure you discuss any questions you have with your health care provider. Document Released: 09/06/2008 Document Revised: 11/16/2015 Document Reviewed: 01/24/2014 Elsevier Interactive Patient Education  2018 [employer].  Low Back Sprain A sprain is a stretch or tear in the bands of tissue that hold bones and joints together (ligaments).  Sprains of the lower back (lumbar spine) are a common cause of low back pain. A sprain occurs when ligaments are overextended or stretched beyond their limits. The ligaments can become inflamed, resulting in pain and sudden muscle tightening (spasms). A sprain can be caused by an injury (trauma), or it can develop gradually due to overuse. There are three types of sprains:  Grade 1 is a mild sprain involving an overstretched ligament or a very slight tear of the ligament.  Grade 2 is a moderate sprain involving a partial tear of the ligament.  Grade 3 is a severe sprain involving a complete tear of the ligament.  What are the causes? This condition may be caused by:  Trauma, such as a fall or a hit to the body.  Twisting or overstretching the back. This may result from doing activities that require a lot of energy, such as lifting heavy objects.  What increases the risk? The following factors may increase your risk of getting this condition:  Playing contact sports.  Participating in sports or activities that put excessive stress on the back and require a lot of bending and twisting, including: ? Lifting weights or heavy objects. ? Gymnastics. ? Soccer. ? Figure skating. ? Snowboarding.  Being overweight or obese.  Having poor strength and flexibility.  What are the signs or symptoms? Symptoms of this condition may include:  Lambert Mody  or dull pain in the lower back that does not go away. Pain may extend to the buttocks.  Stiffness.  Limited range of motion.  Inability to stand up straight due to stiffness or pain.  Muscle spasms.  How is this diagnosed?  This condition may be diagnosed based on:  Your symptoms.  Your medical history.  A physical exam. ? Your health care provider may push on certain areas of your back to determine the source of your pain. ? You may be asked to bend forward, backward, and side to side to assess the severity of your pain and your range  of motion.  Imaging tests, such as: ? X-rays. ? MRI.  How is this treated? Treatment for this condition may include:  Applying heat and cold to the affected area.  Medicines to help relieve pain and to relax your muscles (muscle relaxants).  NSAIDs to help reduce swelling and discomfort.  Physical therapy.  When your symptoms improve, it is important to gradually return to your normal routine as soon as possible to reduce pain, avoid stiffness, and avoid loss of muscle strength. Generally, symptoms should improve within 6 weeks of treatment. However, recovery time varies. Follow these instructions at home: Managing pain, stiffness, and swelling  If directed, apply ice to the injured area during the first 24 hours after your injury. ? Put ice in a plastic bag. ? Place a towel between your skin and the bag. ? Leave the ice on for 20 minutes, 2-3 times a day.  If directed, apply heat to the affected area as often as told by your health care provider. Use the heat source that your health care provider recommends, such as a moist heat pack or a heating pad. ? Place a towel between your skin and the heat source. ? Leave the heat on for 20-30 minutes. ? Remove the heat if your skin turns bright red. This is especially important if you are unable to feel pain, heat, or cold. You may have a greater risk of getting burned. Activity  Rest and return to your normal activities as told by your health care provider. Ask your health care provider what activities are safe for you.  Avoid activities that take a lot of effort (are strenuous) for as long as told by your health care provider.  Do exercises as told by your health care provider. General instructions   Take over-the-counter and prescription medicines only as told by your health care provider.  If you have questions or concerns about safety while taking pain medicine, talk with your health care provider.  Do not drive or operate  heavy machinery until you know how your pain medicine affects you.  Do not use any tobacco products, such as cigarettes, chewing tobacco, and e-cigarettes. Tobacco can delay bone healing. If you need help quitting, ask your health care provider.  Keep all follow-up visits as told by your health care provider. This is important. How is this prevented?  Warm up and stretch before being active.  Cool down and stretch after being active.  Give your body time to rest between periods of activity.  Avoid: ? Being physically inactive for long periods at a time. ? Exercising or playing sports when you are tired or in pain.  Use correct form when playing sports and lifting heavy objects.  Use good posture when sitting and standing.  Maintain a healthy weight.  Sleep on a mattress with medium firmness to support your back.  Make  sure to use equipment that fits you, including shoes that fit well.  Be safe and responsible while being active to avoid falls.  Do at least 150 minutes of moderate-intensity exercise each week, such as brisk walking or water aerobics. Try a form of exercise that takes stress off your back, such as swimming or stationary cycling.  Maintain physical fitness, including: ? Strength. In particular, develop and maintain strong abdominal muscles. ? Flexibility. ? Cardiovascular fitness. ? Endurance. Contact a health care provider if:  Your back pain does not improve after 6 weeks of treatment.  Your symptoms get worse. Get help right away if:  Your back pain is severe.  You are unable to stand or walk.  You develop pain in your legs.  You develop weakness in your buttocks or legs.  You have difficulty controlling when you urinate or when you have a bowel movement. This information is not intended to replace advice given to you by your health care provider. Make sure you discuss any questions you have with your health care provider. Document Released:  06/10/2005 Document Revised: 02/15/2016 Document Reviewed: 03/22/2015 Elsevier Interactive Patient Education  [employer]2018 Elsevier Inc.

## 2017-10-10 NOTE — MAU Note (Signed)
Pt states she smoked marijuana today & did not realize in was laced with cocaine.

## 2017-10-13 LAB — GC/CHLAMYDIA PROBE AMP (~~LOC~~) NOT AT ARMC
CHLAMYDIA, DNA PROBE: NEGATIVE
Neisseria Gonorrhea: NEGATIVE

## 2017-10-16 ENCOUNTER — Ambulatory Visit (INDEPENDENT_AMBULATORY_CARE_PROVIDER_SITE_OTHER): Payer: Medicaid Other | Admitting: Family Medicine

## 2017-10-16 VITALS — BP 125/72 | HR 85 | Wt 224.0 lb

## 2017-10-16 DIAGNOSIS — N340 Urethral abscess: Secondary | ICD-10-CM

## 2017-10-16 DIAGNOSIS — O99333 Smoking (tobacco) complicating pregnancy, third trimester: Secondary | ICD-10-CM | POA: Diagnosis not present

## 2017-10-16 DIAGNOSIS — O99213 Obesity complicating pregnancy, third trimester: Secondary | ICD-10-CM

## 2017-10-16 DIAGNOSIS — O0930 Supervision of pregnancy with insufficient antenatal care, unspecified trimester: Secondary | ICD-10-CM

## 2017-10-16 DIAGNOSIS — O0933 Supervision of pregnancy with insufficient antenatal care, third trimester: Secondary | ICD-10-CM | POA: Diagnosis not present

## 2017-10-16 DIAGNOSIS — O0990 Supervision of high risk pregnancy, unspecified, unspecified trimester: Secondary | ICD-10-CM

## 2017-10-16 DIAGNOSIS — O0993 Supervision of high risk pregnancy, unspecified, third trimester: Secondary | ICD-10-CM

## 2017-10-16 DIAGNOSIS — R87612 Low grade squamous intraepithelial lesion on cytologic smear of cervix (LGSIL): Secondary | ICD-10-CM | POA: Diagnosis not present

## 2017-10-16 DIAGNOSIS — O99210 Obesity complicating pregnancy, unspecified trimester: Secondary | ICD-10-CM

## 2017-10-16 DIAGNOSIS — O99330 Smoking (tobacco) complicating pregnancy, unspecified trimester: Secondary | ICD-10-CM

## 2017-10-16 NOTE — Progress Notes (Signed)
   PRENATAL VISIT NOTE  Subjective:  Brenda Porter is a 32 y.o. [id number] at 6524w1d being seen today for ongoing prenatal care.  She is currently monitored for the following issues for this high-risk pregnancy and has History of gestational diabetes mellitus (GDM); History of shoulder dystocia in prior pregnancy, currently pregnant; Obesity (BMI 30-39.9); Obesity in pregnancy, antepartum; Late prenatal care; Goiter; Supervision of high risk pregnancy, antepartum; Low grade squamous intraepith lesion on cytologic smear cervix (lgsil); and Tobacco smoking affecting pregnancy, antepartum on their problem list.  Patient reports no complaints.  Contractions: Not present. Vag. Bleeding: None.  Movement: Present. Denies leaking of fluid.   The following portions of the patient's history were reviewed and updated as appropriate: allergies, current medications, past family history, past medical history, past social history, past surgical history and problem list. Problem list updated.  Objective:   Vitals:   10/16/17 1103  BP: 125/72  Pulse: 85  Weight: 224 lb (101.6 kg)    Fetal Status: Fetal Heart Rate (bpm): 142 Fundal Height: 34 cm Movement: Present     General:  Alert, oriented and cooperative. Patient is in no acute distress.  Skin: Skin is warm and dry. No rash noted.   Cardiovascular: Normal heart rate noted  Respiratory: Normal respiratory effort, no problems with respiration noted  Abdomen: Soft, gravid, appropriate for gestational age.  Pain/Pressure: Present     Pelvic: Cervical exam deferred      Skenes gland normal in appearance. NO purulent drainage.  Extremities: Normal range of motion.  Edema: None  Mental Status: Normal mood and affect. Normal behavior. Normal judgment and thought content.   Assessment and Plan:  Pregnancy: [id number] at 6824w1d  1. Supervision of high risk pregnancy, antepartum FHT and FH normal. Cannot stay for GTT. Will get HgA1c. Pt to return tomorrow for  2hr GTT. Will get other 28 week labs today as patient may not return. F/u US scheduled. - Tdap vaccine greater than or equal to 7yo IM - CBC - HIV antibody - RPR - US MFM OB FOLLOW UP; Future - Protein / creatinine ratio, urine - Comprehensive metabolic panel - Hemoglobin A1c  2. Obesity in pregnancy, antepartum - Hemoglobin A1c  3. Skene's gland abscess No problems. Finishing antibiotics. No drainage. Well healed.  4. Low grade squamous intraepith lesion on cytologic smear cervix (lgsil) Colpo done -   5. Tobacco smoking affecting pregnancy, antepartum  6. Late prenatal care Patient no-showed several appointments.  Preterm labor symptoms and general obstetric precautions including but not limited to vaginal bleeding, contractions, leaking of fluid and fetal movement were reviewed in detail with the patient. Please refer to After Visit Summary for other counseling recommendations.  Return in about 1 week (around 10/23/2017) for HR OB f/u.  Future Appointments  Date Time Provider Department Center  10/24/2017  8:45 AM [facility] US 2 [facility]    Levie HeritageJacob J Stinson, DO

## 2017-10-17 LAB — COMPREHENSIVE METABOLIC PANEL
ALT: 20 IU/L (ref 0–32)
AST: 14 IU/L (ref 0–40)
Albumin/Globulin Ratio: 1.3 (ref 1.2–2.2)
Albumin: 3.4 g/dL — ABNORMAL LOW (ref 3.5–5.5)
Alkaline Phosphatase: 116 IU/L (ref 39–117)
BUN/Creatinine Ratio: 12 (ref 9–23)
BUN: 7 mg/dL (ref 6–20)
Bilirubin Total: <0.2 mg/dL (ref 0.0–1.2)
CO2: 17 mmol/L — AB (ref 20–29)
CREATININE: 0.59 mg/dL (ref 0.57–1.00)
Calcium: 9 mg/dL (ref 8.7–10.2)
Chloride: 106 mmol/L (ref 96–106)
GFR calc Af Amer: 143 mL/min/{1.73_m2} (ref >59)
GFR calc non Af Amer: 124 mL/min/{1.73_m2} (ref >59)
GLOBULIN, TOTAL: 2.7 g/dL (ref 1.5–4.5)
Glucose: 96 mg/dL (ref 65–99)
POTASSIUM: 4.2 mmol/L (ref 3.5–5.2)
SODIUM: 137 mmol/L (ref 134–144)
Total Protein: 6.1 g/dL (ref 6.0–8.5)

## 2017-10-17 LAB — PROTEIN / CREATININE RATIO, URINE
CREATININE, UR: 126.7 mg/dL
PROTEIN UR: 23.9 mg/dL
Protein/Creat Ratio: 189 mg/g creat (ref 0–200)

## 2017-10-17 LAB — CBC
HEMATOCRIT: 34.5 % (ref 34.0–46.6)
Hemoglobin: 11.4 g/dL (ref 11.1–15.9)
MCH: 31 pg (ref 26.6–33.0)
MCHC: 33 g/dL (ref 31.5–35.7)
MCV: 94 fL (ref 79–97)
PLATELETS: 238 10*3/uL (ref 150–379)
RBC: 3.68 x10E6/uL — ABNORMAL LOW (ref 3.77–5.28)
RDW: 13.5 % (ref 12.3–15.4)
WBC: 14.4 10*3/uL — AB (ref 3.4–10.8)

## 2017-10-17 LAB — HIV ANTIBODY (ROUTINE TESTING W REFLEX): HIV Screen 4th Generation wRfx: NONREACTIVE

## 2017-10-17 LAB — RPR: RPR Ser Ql: NONREACTIVE

## 2017-10-23 ENCOUNTER — Encounter: Payer: Medicaid Other | Admitting: Obstetrics & Gynecology

## 2017-10-24 ENCOUNTER — Ambulatory Visit (HOSPITAL_COMMUNITY): Payer: Medicaid Other

## 2017-10-30 ENCOUNTER — Ambulatory Visit (HOSPITAL_COMMUNITY)
Admission: RE | Admit: 2017-10-30 | Discharge: 2017-10-30 | Disposition: A | Discharge status: Home / Self Care | Payer: Medicaid Other | Source: Ambulatory Visit | Attending: Family Medicine | Admitting: Family Medicine

## 2017-10-30 DIAGNOSIS — O0990 Supervision of high risk pregnancy, unspecified, unspecified trimester: Secondary | ICD-10-CM

## 2017-11-13 ENCOUNTER — Encounter: Payer: Medicaid Other | Admitting: Student

## 2017-11-21 ENCOUNTER — Encounter: Payer: Self-pay | Admitting: Obstetrics and Gynecology

## 2017-11-21 ENCOUNTER — Encounter: Payer: Medicaid Other | Admitting: Obstetrics and Gynecology

## 2017-11-24 ENCOUNTER — Inpatient Hospital Stay (HOSPITAL_COMMUNITY)
Admission: AD | Admit: 2017-11-24 | Discharge: 2017-11-26 | DRG: 806 | Disposition: A | Discharge status: Home / Self Care | Payer: Medicaid Other | Attending: Obstetrics and Gynecology | Admitting: Obstetrics and Gynecology

## 2017-11-24 ENCOUNTER — Encounter (HOSPITAL_COMMUNITY): Payer: Self-pay

## 2017-11-24 ENCOUNTER — Inpatient Hospital Stay (HOSPITAL_COMMUNITY): Payer: Medicaid Other | Admitting: Anesthesiology

## 2017-11-24 ENCOUNTER — Other Ambulatory Visit: Payer: Self-pay

## 2017-11-24 DIAGNOSIS — O99334 Smoking (tobacco) complicating childbirth: Secondary | ICD-10-CM | POA: Diagnosis present

## 2017-11-24 DIAGNOSIS — O9882 Other maternal infectious and parasitic diseases complicating childbirth: Secondary | ICD-10-CM | POA: Diagnosis present

## 2017-11-24 DIAGNOSIS — F1290 Cannabis use, unspecified, uncomplicated: Secondary | ICD-10-CM | POA: Diagnosis present

## 2017-11-24 DIAGNOSIS — Z3483 Encounter for supervision of other normal pregnancy, third trimester: Secondary | ICD-10-CM | POA: Diagnosis present

## 2017-11-24 DIAGNOSIS — Z8632 Personal history of gestational diabetes: Secondary | ICD-10-CM | POA: Diagnosis present

## 2017-11-24 DIAGNOSIS — Z880 Allergy status to penicillin: Secondary | ICD-10-CM

## 2017-11-24 DIAGNOSIS — O99324 Drug use complicating childbirth: Principal | ICD-10-CM | POA: Diagnosis present

## 2017-11-24 DIAGNOSIS — O99330 Smoking (tobacco) complicating pregnancy, unspecified trimester: Secondary | ICD-10-CM

## 2017-11-24 DIAGNOSIS — Z91040 Latex allergy status: Secondary | ICD-10-CM

## 2017-11-24 DIAGNOSIS — O99210 Obesity complicating pregnancy, unspecified trimester: Secondary | ICD-10-CM | POA: Diagnosis present

## 2017-11-24 DIAGNOSIS — F1490 Cocaine use, unspecified, uncomplicated: Secondary | ICD-10-CM | POA: Diagnosis present

## 2017-11-24 DIAGNOSIS — B372 Candidiasis of skin and nail: Secondary | ICD-10-CM | POA: Diagnosis present

## 2017-11-24 DIAGNOSIS — O479 False labor, unspecified: Secondary | ICD-10-CM | POA: Diagnosis present

## 2017-11-24 DIAGNOSIS — F17210 Nicotine dependence, cigarettes, uncomplicated: Secondary | ICD-10-CM | POA: Diagnosis present

## 2017-11-24 DIAGNOSIS — Z3A39 39 weeks gestation of pregnancy: Secondary | ICD-10-CM

## 2017-11-24 DIAGNOSIS — O0990 Supervision of high risk pregnancy, unspecified, unspecified trimester: Secondary | ICD-10-CM

## 2017-11-24 DIAGNOSIS — O09299 Supervision of pregnancy with other poor reproductive or obstetric history, unspecified trimester: Secondary | ICD-10-CM

## 2017-11-24 DIAGNOSIS — O99214 Obesity complicating childbirth: Secondary | ICD-10-CM | POA: Diagnosis present

## 2017-11-24 LAB — COMPREHENSIVE METABOLIC PANEL
ALBUMIN: 2.8 g/dL — AB (ref 3.5–5.0)
ALT: 18 U/L (ref 14–54)
ANION GAP: 9 (ref 5–15)
AST: 23 U/L (ref 15–41)
Alkaline Phosphatase: 174 U/L — ABNORMAL HIGH (ref 38–126)
BILIRUBIN TOTAL: 0.4 mg/dL (ref 0.3–1.2)
BUN: 13 mg/dL (ref 6–20)
CHLORIDE: 106 mmol/L (ref 101–111)
CO2: 21 mmol/L — ABNORMAL LOW (ref 22–32)
Calcium: 8.9 mg/dL (ref 8.9–10.3)
Creatinine, Ser: 0.75 mg/dL (ref 0.44–1.00)
GFR calc Af Amer: >60 mL/min (ref >60)
GFR calc non Af Amer: >60 mL/min (ref >60)
GLUCOSE: 78 mg/dL (ref 65–99)
POTASSIUM: 4.9 mmol/L (ref 3.5–5.1)
SODIUM: 136 mmol/L (ref 135–145)
TOTAL PROTEIN: 6.3 g/dL — AB (ref 6.5–8.1)

## 2017-11-24 LAB — CBC
HEMATOCRIT: 36.2 % (ref 36.0–46.0)
HEMOGLOBIN: 11.9 g/dL — AB (ref 12.0–15.0)
MCH: 31.3 pg (ref 26.0–34.0)
MCHC: 32.9 g/dL (ref 30.0–36.0)
MCV: 95.3 fL (ref 78.0–100.0)
Platelets: 197 10*3/uL (ref 150–400)
RBC: 3.8 MIL/uL — ABNORMAL LOW (ref 3.87–5.11)
RDW: 13.9 % (ref 11.5–15.5)
WBC: 12.9 10*3/uL — ABNORMAL HIGH (ref 4.0–10.5)

## 2017-11-24 LAB — RAPID URINE DRUG SCREEN, HOSP PERFORMED
AMPHETAMINES: NOT DETECTED
BENZODIAZEPINES: NOT DETECTED
Barbiturates: NOT DETECTED
COCAINE: POSITIVE — AB
OPIATES: NOT DETECTED
Tetrahydrocannabinol: NOT DETECTED

## 2017-11-24 LAB — PROTEIN / CREATININE RATIO, URINE
Creatinine, Urine: 98 mg/dL
PROTEIN CREATININE RATIO: 0.34 mg/mg{creat} — AB (ref 0.00–0.15)
Total Protein, Urine: 33 mg/dL

## 2017-11-24 LAB — TYPE AND SCREEN
ABO/RH(D): A POS
Antibody Screen: NEGATIVE

## 2017-11-24 LAB — RPR: RPR: NONREACTIVE

## 2017-11-24 LAB — GROUP B STREP BY PCR: Group B strep by PCR: NEGATIVE

## 2017-11-24 LAB — GLUCOSE, CAPILLARY: GLUCOSE-CAPILLARY: 90 mg/dL (ref 65–99)

## 2017-11-24 MED ORDER — TETANUS-DIPHTH-ACELL PERTUSSIS 5-2.5-18.5 LF-MCG/0.5 IM SUSP: 0.5000 mL | Freq: Once | INTRAMUSCULAR | Status: DC

## 2017-11-24 MED ORDER — HYDRALAZINE HCL 20 MG/ML IJ SOLN: 10.0000 mg | Freq: Once | INTRAMUSCULAR | Status: DC | PRN

## 2017-11-24 MED ORDER — OXYTOCIN 40 UNITS IN LACTATED RINGERS INFUSION - SIMPLE MED
2.5000 [IU]/h | INTRAVENOUS | Status: DC
  Filled 2017-11-24: qty 1000

## 2017-11-24 MED ORDER — COCONUT OIL OIL: 1.0000 "application " | TOPICAL_OIL | Status: DC | PRN

## 2017-11-24 MED ORDER — WITCH HAZEL-GLYCERIN EX PADS: 1.0000 "application " | MEDICATED_PAD | CUTANEOUS | Status: DC | PRN

## 2017-11-24 MED ORDER — DIPHENHYDRAMINE HCL 25 MG PO CAPS: 25.0000 mg | ORAL_CAPSULE | Freq: Four times a day (QID) | ORAL | Status: DC | PRN

## 2017-11-24 MED ORDER — DIBUCAINE 1 % RE OINT: 1.0000 "application " | TOPICAL_OINTMENT | RECTAL | Status: DC | PRN

## 2017-11-24 MED ORDER — SIMETHICONE 80 MG PO CHEW: 80.0000 mg | CHEWABLE_TABLET | ORAL | Status: DC | PRN

## 2017-11-24 MED ORDER — FENTANYL CITRATE (PF) 100 MCG/2ML IJ SOLN: 100.0000 ug | INTRAMUSCULAR | Status: DC | PRN

## 2017-11-24 MED ORDER — PHENYLEPHRINE 40 MCG/ML (10ML) SYRINGE FOR IV PUSH (FOR BLOOD PRESSURE SUPPORT)
PREFILLED_SYRINGE | INTRAVENOUS | Status: AC
  Filled 2017-11-24: qty 20

## 2017-11-24 MED ORDER — SENNOSIDES-DOCUSATE SODIUM 8.6-50 MG PO TABS
2.0000 | ORAL_TABLET | ORAL | Status: DC
  Administered 2017-11-24 – 2017-11-25 (×2): 2 via ORAL
  Filled 2017-11-24 (×2): qty 2

## 2017-11-24 MED ORDER — FENTANYL 2.5 MCG/ML BUPIVACAINE 1/10 % EPIDURAL INFUSION (WH - ANES)
INTRAMUSCULAR | Status: AC
  Filled 2017-11-24: qty 100

## 2017-11-24 MED ORDER — EPHEDRINE 5 MG/ML INJ
10.0000 mg | INTRAVENOUS | Status: DC | PRN
  Filled 2017-11-24: qty 2

## 2017-11-24 MED ORDER — PHENYLEPHRINE 40 MCG/ML (10ML) SYRINGE FOR IV PUSH (FOR BLOOD PRESSURE SUPPORT)
80.0000 ug | PREFILLED_SYRINGE | INTRAVENOUS | Status: DC | PRN
  Filled 2017-11-24: qty 5

## 2017-11-24 MED ORDER — OXYTOCIN BOLUS FROM INFUSION
500.0000 mL | Freq: Once | INTRAVENOUS | Status: AC
  Administered 2017-11-24: 500 mL via INTRAVENOUS

## 2017-11-24 MED ORDER — OXYCODONE-ACETAMINOPHEN 5-325 MG PO TABS
1.0000 | ORAL_TABLET | ORAL | Status: DC | PRN
Start: 2017-11-24 — End: 2017-11-24

## 2017-11-24 MED ORDER — ACETAMINOPHEN 325 MG PO TABS: 650.0000 mg | ORAL_TABLET | ORAL | Status: DC | PRN

## 2017-11-24 MED ORDER — TERBUTALINE SULFATE 1 MG/ML IJ SOLN
0.2500 mg | Freq: Once | INTRAMUSCULAR | Status: DC | PRN
  Filled 2017-11-24: qty 1

## 2017-11-24 MED ORDER — LABETALOL HCL 5 MG/ML IV SOLN
20.0000 mg | INTRAVENOUS | Status: DC | PRN
  Administered 2017-11-24: 20 mg via INTRAVENOUS

## 2017-11-24 MED ORDER — LABETALOL HCL 5 MG/ML IV SOLN
INTRAVENOUS | Status: AC
  Filled 2017-11-24: qty 4

## 2017-11-24 MED ORDER — ONDANSETRON HCL 4 MG PO TABS: 4.0000 mg | ORAL_TABLET | ORAL | Status: DC | PRN

## 2017-11-24 MED ORDER — SOD CITRATE-CITRIC ACID 500-334 MG/5ML PO SOLN: 30.0000 mL | ORAL | Status: DC | PRN

## 2017-11-24 MED ORDER — ZOLPIDEM TARTRATE 5 MG PO TABS: 5.0000 mg | ORAL_TABLET | Freq: Every evening | ORAL | Status: DC | PRN

## 2017-11-24 MED ORDER — OXYTOCIN 40 UNITS IN LACTATED RINGERS INFUSION - SIMPLE MED
1.0000 m[IU]/min | INTRAVENOUS | Status: DC
  Administered 2017-11-24: 2 m[IU]/min via INTRAVENOUS

## 2017-11-24 MED ORDER — FENTANYL 2.5 MCG/ML BUPIVACAINE 1/10 % EPIDURAL INFUSION (WH - ANES)
14.0000 mL/h | INTRAMUSCULAR | Status: DC | PRN
  Administered 2017-11-24 (×2): 14 mL/h via EPIDURAL

## 2017-11-24 MED ORDER — LACTATED RINGERS IV SOLN
INTRAVENOUS | Status: DC
  Administered 2017-11-24 (×2): via INTRAVENOUS

## 2017-11-24 MED ORDER — ACETAMINOPHEN 325 MG PO TABS
650.0000 mg | ORAL_TABLET | ORAL | Status: DC | PRN
  Administered 2017-11-24 – 2017-11-26 (×6): 650 mg via ORAL
  Filled 2017-11-24 (×6): qty 2

## 2017-11-24 MED ORDER — BENZOCAINE-MENTHOL 20-0.5 % EX AERO
1.0000 "application " | INHALATION_SPRAY | CUTANEOUS | Status: DC | PRN
  Administered 2017-11-24: 1 via TOPICAL
  Filled 2017-11-24: qty 56

## 2017-11-24 MED ORDER — LIDOCAINE HCL (PF) 1 % IJ SOLN
30.0000 mL | INTRAMUSCULAR | Status: DC | PRN
  Filled 2017-11-24: qty 30

## 2017-11-24 MED ORDER — ONDANSETRON HCL 4 MG/2ML IJ SOLN
4.0000 mg | Freq: Four times a day (QID) | INTRAMUSCULAR | Status: DC | PRN
  Administered 2017-11-24: 4 mg via INTRAVENOUS
  Filled 2017-11-24: qty 2

## 2017-11-24 MED ORDER — LACTATED RINGERS IV SOLN
500.0000 mL | Freq: Once | INTRAVENOUS | Status: AC
  Administered 2017-11-24: 500 mL via INTRAVENOUS

## 2017-11-24 MED ORDER — ONDANSETRON HCL 4 MG/2ML IJ SOLN: 4.0000 mg | INTRAMUSCULAR | Status: DC | PRN

## 2017-11-24 MED ORDER — DIPHENHYDRAMINE HCL 50 MG/ML IJ SOLN
12.5000 mg | INTRAMUSCULAR | Status: DC | PRN
  Administered 2017-11-24: 12.5 mg via INTRAVENOUS
  Filled 2017-11-24: qty 1

## 2017-11-24 MED ORDER — PRENATAL MULTIVITAMIN CH
1.0000 | ORAL_TABLET | Freq: Every day | ORAL | Status: DC
  Administered 2017-11-25 – 2017-11-26 (×2): 1 via ORAL
  Filled 2017-11-24 (×2): qty 1

## 2017-11-24 MED ORDER — LIDOCAINE HCL (PF) 1 % IJ SOLN
INTRAMUSCULAR | Status: DC | PRN
  Administered 2017-11-24 (×2): 5 mL via EPIDURAL

## 2017-11-24 MED ORDER — LACTATED RINGERS IV SOLN: 500.0000 mL | INTRAVENOUS | Status: DC | PRN

## 2017-11-24 MED ORDER — IBUPROFEN 600 MG PO TABS
600.0000 mg | ORAL_TABLET | Freq: Four times a day (QID) | ORAL | Status: DC
  Administered 2017-11-24 – 2017-11-26 (×9): 600 mg via ORAL
  Filled 2017-11-24 (×9): qty 1

## 2017-11-24 MED ORDER — OXYCODONE-ACETAMINOPHEN 5-325 MG PO TABS: 2.0000 | ORAL_TABLET | ORAL | Status: DC | PRN

## 2017-11-24 NOTE — Anesthesia Preprocedure Evaluation (Signed)
Anesthesia Evaluation  Patient identified by MRN, date of birth, ID band Patient awake    Reviewed: Allergy & Precautions, NPO status , Patient's Chart, lab work & pertinent test results  History of Anesthesia Complications Negative for: history of anesthetic complications  Airway Mallampati: III  TM Distance: >3 FB Neck ROM: Full    Dental no notable dental hx. (+) Dental Advisory Given   Pulmonary Current Smoker,    Pulmonary exam normal        Cardiovascular negative cardio ROS Normal cardiovascular exam     Neuro/Psych PSYCHIATRIC DISORDERS Depression Bipolar Disorder negative neurological ROS     GI/Hepatic GERD  ,(+)     substance abuse  ,   Endo/Other  diabetesMorbid obesity  Renal/GU negative Renal ROS     Musculoskeletal   Abdominal   Peds  Hematology   Anesthesia Other Findings   Reproductive/Obstetrics                             Anesthesia Physical Anesthesia Plan  ASA: III  Anesthesia Plan: Epidural   Post-op Pain Management:    Induction:   PONV Risk Score and Plan:   Airway Management Planned: Natural Airway  Additional Equipment:   Intra-op Plan:   Post-operative Plan:   Informed Consent: I have reviewed the patients History and Physical, chart, labs and discussed the procedure including the risks, benefits and alternatives for the proposed anesthesia with the patient or authorized representative who has indicated his/her understanding and acceptance.   Dental advisory given  Plan Discussed with: Anesthesiologist  Anesthesia Plan Comments:         Anesthesia Quick Evaluation

## 2017-11-24 NOTE — Anesthesia Procedure Notes (Signed)
Epidural Patient location during procedure: OB Start time: 11/24/2017 7:43 AM End time: 11/24/2017 7:53 AM  Staffing Anesthesiologist: Heather RobertsSinger, Ylianna Almanzar, MD Performed: anesthesiologist   Preanesthetic Checklist Completed: patient identified, site marked, pre-op evaluation, timeout performed, IV checked, risks and benefits discussed and monitors and equipment checked  Epidural Patient position: sitting Prep: DuraPrep Patient monitoring: heart rate, cardiac monitor, continuous pulse ox and blood pressure Approach: midline Location: L2-L3 Injection technique: LOR saline  Needle:  Needle type: Tuohy  Needle gauge: 17 G Needle length: 9 cm Needle insertion depth: 8 cm Catheter size: 20 Guage Catheter at skin depth: 11 cm Test dose: negative and Other  Assessment Events: blood not aspirated, injection not painful, no injection resistance and negative IV test  Additional Notes Informed consent obtained prior to proceeding including risk of failure, 1% risk of PDPH, risk of minor discomfort and bruising.  Discussed rare but serious complications including epidural abscess, permanent nerve injury, epidural hematoma.  Discussed alternatives to epidural analgesia and patient desires to proceed.  Timeout performed pre-procedure verifying patient name, procedure, and platelet count.  Patient tolerated procedure well.

## 2017-11-24 NOTE — H&P (Signed)
LABOR AND DELIVERY ADMISSION HISTORY AND PHYSICAL NOTE  Brenda Porter is a 32 y.o. female [phone] with IUP at [redacted]w[redacted]d by 6-wk U/S presenting for SOL.  She reports positive fetal movement. She denies leakage of fluid or vaginal bleeding.  Prenatal History/Complications: PNC at [facility] Pregnancy complications:  - Limited PNC - Hx depression - Hx GDM - Tobacco smoking - UDS positive for THC and cocaine on 10/10/17  Past Medical History: Past Medical History:  Diagnosis Date  . Bipolar 1 disorder, depressed (HCC)   . Depression   . GERD (gastroesophageal reflux disease)   . Gestational diabetes   . Goiter   . Shoulder dystocia 02/12/2012  . UTI (urinary tract infection) in pregnancy in first trimester     Past Surgical History: Past Surgical History:  Procedure Laterality Date  . WISDOM TOOTH EXTRACTION      Obstetrical History: OB History    Gravida  5   Para  2   Term  2   Preterm      AB  2   Living  2     SAB  1   TAB      Ectopic  1   Multiple      Live Births  2           Social History: Social History   Socioeconomic History  . Marital status: Single    Spouse name: Not on file  . Number of children: Not on file  . Years of education: Not on file  . Highest education level: Not on file  Occupational History  . Not on file  Social Needs  . Financial resource strain: Not on file  . Food insecurity:    Worry: Not on file    Inability: Not on file  . Transportation needs:    Medical: Not on file    Non-medical: Not on file  Tobacco Use  . Smoking status: Current Every Day Smoker    Packs/day: 0.50    Years: 10.00    Pack years: 5.00    Types: Cigarettes  . Smokeless tobacco: Never Used  Substance and Sexual Activity  . Alcohol use: No  . Drug use: Yes    Types: Marijuana    Comment: marijuana 11-17-17  . Sexual activity: Yes    Birth control/protection: None  Lifestyle  . Physical activity:    Days per week: Not on file     Minutes per session: Not on file  . Stress: Not on file  Relationships  . Social connections:    Talks on phone: Not on file    Gets together: Not on file    Attends religious service: Not on file    Active member of club or organization: Not on file    Attends meetings of clubs or organizations: Not on file    Relationship status: Not on file  Other Topics Concern  . Not on file  Social History Narrative  . Not on file    Family History: Family History  Problem Relation Age of Onset  . Hyperthyroidism Mother   . Hypertension Father   . Diabetes Father   . Blindness Father   . Hyperthyroidism Sister   . Cancer Maternal Grandmother   . Hyperthyroidism Maternal Grandmother   . Other Neg Hx     Allergies: Allergies  Allergen Reactions  . Codeine Nausea And Vomiting, Swelling and Rash  . Latex Itching and Rash  . Penicillins Rash  Has patient had a PCN reaction causing immediate rash, facial/tongue/throat swelling, SOB or lightheadedness with hypotension: No Has patient had a PCN reaction causing severe rash involving mucus membranes or skin necrosis: No Has patient had a PCN reaction that required hospitalization: No Has patient had a PCN reaction occurring within the last 10 years: No If all of the above answers are "NO", then may proceed with Cephalosporin use.     Medications Prior to Admission  Medication Sig Dispense Refill Last Dose  . Prenatal Vit-Fe Fumarate-FA (MULTIVITAMIN-PRENATAL) 27-0.8 MG TABS tablet Take 1 tablet by mouth daily at 12 noon.   Past Week at Unknown time  . cyclobenzaprine (FLEXERIL) 10 MG tablet Take 1 tablet (10 mg total) by mouth 3 (three) times daily as needed for muscle spasms. 30 tablet 0 More than a month at Unknown time     Review of Systems  All systems reviewed and negative except as stated in HPI  Physical Exam Blood pressure 117/61, pulse 70, temperature 98.2 F (36.8 C), temperature source Oral, resp. rate 18, height  5\' 5"  (1.651 m), weight 100.7 kg (222 lb), last menstrual period 01/06/2017, SpO2 100 %, unknown if currently breastfeeding. General appearance: alert, oriented Lungs: normal respiratory effort Heart: regular rate Abdomen: soft, non-tender; gravid, FH appropriate for GA Extremities: No calf swelling or tenderness Presentation: cephalic by SVE Fetal monitoring: baseline rate 125, moderate variability, + acel, rare mild variable decels Uterine activity: ctx q 2-5 min Dilation: 6.5 Effacement (%): 80 Station: -2 Exam by:: Lorretta Harp RNC  Prenatal labs: ABO, Rh: --/--/A POS (06/03 2130) Antibody: NEG (06/03 0654) Rubella: 5.92 (12/13 1622) RPR: Non Reactive (04/25 1124)  HBsAg: Negative (12/13 1622)  HIV: Non Reactive (04/25 1124)  GC/Chlamydia: negative 10/10/17 GBS:  negative PCR today  2-hr GTT: N/A; neg 1st trimester 1-hr glucola  Genetic screening:  Negative Quad screen Anatomy US: normal visualized anatomy, ut limited cardiac evaluation d/t fetal position (follow up U/S not completed)  Prenatal Transfer Tool  Maternal Diabetes: No, but did not get 3rd trimester testing Genetic Screening: Normal Maternal Ultrasounds/Referrals: Normal but limited Fetal Ultrasounds or other Referrals:  None Maternal Substance Abuse:  Yes:  Type: Marijuana, Cocaine Significant Maternal Medications:  None Significant Maternal Lab Results: Lab values include: Group B Strep negative  Results for orders placed or performed during the hospital encounter of 11/24/17 (from the past 24 hour(s))  CBC   Collection Time: 11/24/17  6:54 AM  Result Value Ref Range   WBC 12.9 (H) 4.0 - 10.5 K/uL   RBC 3.80 (L) 3.87 - 5.11 MIL/uL   Hemoglobin 11.9 (L) 12.0 - 15.0 g/dL   HCT 86.5 78.4 - 69.6 %   MCV 95.3 78.0 - 100.0 fL   MCH 31.3 26.0 - 34.0 pg   MCHC 32.9 30.0 - 36.0 g/dL   RDW 29.5 28.4 - 13.2 %   Platelets 197 150 - 400 K/uL  Comprehensive metabolic panel   Collection Time: 11/24/17  6:54 AM  Result  Value Ref Range   Sodium 136 135 - 145 mmol/L   Potassium 4.9 3.5 - 5.1 mmol/L   Chloride 106 101 - 111 mmol/L   CO2 21 (L) 22 - 32 mmol/L   Glucose, Bld 78 65 - 99 mg/dL   BUN 13 6 - 20 mg/dL   Creatinine, Ser 4.40 0.44 - 1.00 mg/dL   Calcium 8.9 8.9 - 10.2 mg/dL   Total Protein 6.3 (L) 6.5 - 8.1 g/dL   Albumin  2.8 (L) 3.5 - 5.0 g/dL   AST 23 15 - 41 U/L   ALT 18 14 - 54 U/L   Alkaline Phosphatase 174 (H) 38 - 126 U/L   Total Bilirubin 0.4 0.3 - 1.2 mg/dL   GFR calc non Af Amer >60 >60 mL/min   GFR calc Af Amer >60 >60 mL/min   Anion gap 9 5 - 15  Type and screen [facility]   Collection Time: 11/24/17  6:54 AM  Result Value Ref Range   ABO/RH(D) A POS    Antibody Screen NEG    Sample Expiration      11/27/2017 Performed at [facility], [address] [place], [place] [address]   Group B strep by PCR   Collection Time: 11/24/17  6:55 AM  Result Value Ref Range   Group B strep by PCR NEGATIVE NEGATIVE    Assessment: Brenda Porter is a 32 y.o. [id number] at 5223w5d here for SOL. Pregnancy complicated by limited PNC, hx of shoulder dystocia (8lb3oz baby) w/ hx of GDM. Had negative 1-hr screen in first trimester this pregnancy, but no 3rd trimester screen. Also did not have growth scan  #Labor: expectant management; Shoulder dystocia precautions #Pain: epidural #FWB: Cat II #ID:  GBS neg #MOF: bottle #MOC: Depo  #Elevated BPs: BP elevated on arrival now improved. CBC, CMP wnl. UPC pending. Asymptomatic. Continue to monitor  #Hx of substance abuse: +THC and cocaine in April 2019. Get UDS  #Limited PNC: SW consult  Kandra NicolasJulie P Degele 11/24/2017, 8:57 AM

## 2017-11-24 NOTE — Anesthesia Pain Management Evaluation Note (Signed)
  CRNA Pain Management Visit Note  Patient: Brenda HammanKathleen N Presswood, 32 y.o., female  "Hello I am a member of the anesthesia team at [facility]. We have an anesthesia team available at all times to provide care throughout the hospital, including epidural management and anesthesia for C-section. I don't know your plan for the delivery whether it a natural birth, water birth, IV sedation, nitrous supplementation, doula or epidural, but we want to meet your pain goals."   1.Was your pain managed to your expectations on prior hospitalizations?   Yes   2.What is your expectation for pain management during this hospitalization?     Epidural  3.How can we help you reach that goal? Dose epidural as necessary  Record the patient's initial score and the patient's pain goal.   Pain: 0  Pain Goal: 5 [facility] wants you to be able to say your pain was always managed very well.  Cleda ClarksBrowder, Andruw Battie R 11/24/2017

## 2017-11-24 NOTE — Progress Notes (Signed)
SVE 7cm/80-90%/-2 Ctx pattern somewhat irregular  FHR 130 baseline, moderate variabiliy, +acel, occasional variable  AROM, clear fluid. Pt tolerated well. Expectant management   Kandra NicolasJulie P. Degele, MD OB Fellow

## 2017-11-24 NOTE — Progress Notes (Signed)
Patient ID: Brenda HammanKathleen N Porter, female   DOB: 05/15/1986, 32 y.o.   [id number] [facility] History of mild shoulder dystocia with last pregnancy. Pt reports her legs were held back and she received suprapubic pressure. Infant delivered without problems with these maneuvers. Infant wt 8 # 3 oz, no nerve impairment post partum. Only wt for this infant was at anatomy scan, 43 %.  Review however of L & D note does not mention a problems with shoulder dystocia, just pt's discharge summary.  Discussed shoulder dystocia and current pregnancy with pt. Labor progressing well. Do clinical indication for primary c section at this time. Will continue with expectant management.

## 2017-11-25 ENCOUNTER — Other Ambulatory Visit: Payer: Self-pay

## 2017-11-25 MED ORDER — CLOTRIMAZOLE 1 % EX CREA
TOPICAL_CREAM | Freq: Two times a day (BID) | CUTANEOUS | Status: DC
  Administered 2017-11-25 (×2): via TOPICAL
  Filled 2017-11-25: qty 15

## 2017-11-25 NOTE — Progress Notes (Signed)
POSTPARTUM PROGRESS NOTE  Post Partum Day 1  Subjective:  Caroleen HammanKathleen N Walrond is a 32 y.o. [id number] s/p SVD at 8425w5d.  She reports she is doing well. No acute events overnight. She denies any problems with ambulating, voiding or po intake. Denies nausea or vomiting.  Pain is well controlled. Only concerns is rash under breast  Objective: Blood pressure 125/85, pulse (!) 52, temperature 99.1 F (37.3 C), temperature source Oral, resp. rate 18, height 5\' 5"  (1.651 m), weight 100.7 kg (222 lb), last menstrual period 01/06/2017, SpO2 100 %, unknown if currently breastfeeding.  Physical Exam:  General: alert, cooperative and no distress Chest: no respiratory distress Heart:regular rate, distal pulses intact Abdomen: soft, nontender,  Uterine Fundus: firm, appropriately tender DVT Evaluation: No calf swelling or tenderness Extremities: no edema Skin: warm, dry; erythematous moist rash under left breast with some satellite lesions c/w yeast infection  Recent Labs    11/24/17 0654  HGB 11.9*  HCT 36.2    Assessment/Plan: Caroleen HammanKathleen N Stoneman is a 32 y.o. [id number] s/p SVD at 1225w5d   PPD#1 - Doing well  Routine postpartum care Candidal intertrigo: clotrimazole cream Contraception: Depo Feeding: bottle Dispo: Plan for discharge tomorrow.   LOS: 1 day   Kandra NicolasJulie P DegeleMD 11/25/2017, 12:23 PM

## 2017-11-25 NOTE — Plan of Care (Signed)
  Problem: Coping: Goal: Ability to identify and utilize available resources and services will improve 11/25/2017 1745 by Princess PernaBurgess, Cameryn Schum D, RN Outcome: Progressing Note:  [facility] CSW to assess pt & recommendation made to await CPS before infant d/c.   Problem: Life Cycle: Goal: Chance of risk for complications during the postpartum period will decrease 11/25/2017 1745 by Princess PernaBurgess, Gerritt Galentine D, RN Outcome: Progressing

## 2017-11-25 NOTE — Progress Notes (Signed)
Psychosocial assessment completed.  Full documentation to follow.  [facility] and is awaiting involvement prior to feeling comfortable with discharging baby due to positive cocaine screen.  CSW will follow up with CPS tomorrow morning regarding case.

## 2017-11-25 NOTE — Anesthesia Postprocedure Evaluation (Signed)
Anesthesia Post Note  Patient: [place]  Procedure(s) Performed: AN AD HOC LABOR EPIDURAL     Patient location during evaluation: Mother Baby Anesthesia Type: Epidural Level of consciousness: awake Pain management: pain level controlled Vital Signs Assessment: post-procedure vital signs reviewed and stable Respiratory status: spontaneous breathing Cardiovascular status: stable Postop Assessment: epidural receding and patient able to bend at knees Anesthetic complications: no    Last Vitals:  Vitals:   11/25/17 0554 11/25/17 0646  BP:  125/85  Pulse:  (!) 52  Resp: 18   Temp: 37.3 C   SpO2:      Last Pain:  Vitals:   11/25/17 1008  TempSrc:   PainSc: 3    Pain Goal: Patients Stated Pain Goal: 3 (11/25/17 0920)               Edison PaceWILKERSON,Maximilliano Kersh

## 2017-11-26 MED ORDER — NYSTATIN 100000 UNIT/GM EX POWD
Freq: Four times a day (QID) | CUTANEOUS | 0 refills | Status: AC
Start: 2017-11-26 — End: ?

## 2017-11-26 MED ORDER — CLOTRIMAZOLE 1 % EX CREA: TOPICAL_CREAM | Freq: Two times a day (BID) | CUTANEOUS | 0 refills | Status: AC

## 2017-11-26 MED ORDER — MEDROXYPROGESTERONE ACETATE 150 MG/ML IM SUSP
150.0000 mg | Freq: Once | INTRAMUSCULAR | Status: AC
Start: 2017-11-26 — End: 2017-11-26
  Administered 2017-11-26: 150 mg via INTRAMUSCULAR
  Filled 2017-11-26: qty 1

## 2017-11-26 MED ORDER — IBUPROFEN 600 MG PO TABS: 600.0000 mg | ORAL_TABLET | Freq: Four times a day (QID) | ORAL | 0 refills | Status: AC

## 2017-11-26 NOTE — Progress Notes (Signed)
CPS worker called to say she is reviewing MOB's CPS hx and staffing case with her supervisor.  She stay in touch with CSW regarding plan.  CSW informed her that baby is ready for discharge, but that CSW does not feel comfortable discharging baby to MOB's care until CPS has evaluated.  CPS worker agrees.

## 2017-11-26 NOTE — Progress Notes (Signed)
CSW received call from CPS worker stating [facility] and instructed her that she will not be able to care for baby without supervision from a suitable adult and that MOB needs to identify who this person will be.  CPS worker states that she has requested an assist [facility] to come to the hospital to meet with MOB to initiate the paperwork.  CPS worker states that baby cannot discharge until the Safety Resource (person to provide supervision [facility]) has been identified and is at the hospital with MOB for baby's discharge.  CSW spoke with pediatrician to update.  CSW is awaiting a call back from CPS worker once Safety Resource has been identified and Safety Plan has been completed and signed.

## 2017-11-26 NOTE — Progress Notes (Signed)
CSW received consult for Edinburgh Score of 9.  CSW has previously met with MOB and discussed PMADs, however, a score of 9 is not high enough for an automatic CSW consult.  Please contact CSW by MOB's request of if current concerns arise that have not already been addressed.

## 2017-11-26 NOTE — Progress Notes (Signed)
CPS is evaluating PGM as possible [place] [facility] for baby, but awaiting assist from [place] to meet face to face with MOB and with [place] to complete a home visit at PGM's home, who lives in [place].  CPS worker asks that baby remain in the hospital until this has been completed and anticipates that this will be completed tomorrow. 

## 2017-11-26 NOTE — Discharge Instructions (Signed)
Vaginal Delivery, Care After °Refer to this sheet in the next few weeks. These instructions provide you with information about caring for yourself after vaginal delivery. Your health care provider may also give you more specific instructions. Your treatment has been planned according to current medical practices, but problems sometimes occur. Call your health care provider if you have any problems or questions. °What can I expect after the procedure? °After vaginal delivery, it is common to have: °· Some bleeding from your vagina. °· Soreness in your abdomen, your vagina, and the area of skin between your vaginal opening and your anus (perineum). °· Pelvic cramps. °· Fatigue. ° °Follow these instructions at home: °Medicines °· Take over-the-counter and prescription medicines only as told by your health care provider. °· If you were prescribed an antibiotic medicine, take it as told by your health care provider. Do not stop taking the antibiotic until it is finished. °Driving ° °· Do not drive or operate heavy machinery while taking prescription pain medicine. °· Do not drive for 24 hours if you received a sedative. °Lifestyle °· Do not drink alcohol. This is especially important if you are breastfeeding or taking medicine to relieve pain. °· Do not use tobacco products, including cigarettes, chewing tobacco, or e-cigarettes. If you need help quitting, ask your health care provider. °Eating and drinking °· Drink at least 8 eight-ounce glasses of water every day unless you are told not to by your health care provider. If you choose to breastfeed your baby, you may need to drink more water than this. °· Eat high-fiber foods every day. These foods may help prevent or relieve constipation. High-fiber foods include: °? Whole grain cereals and breads. °? Brown rice. °? Beans. °? Fresh fruits and vegetables. °Activity °· Return to your normal activities as told by your health care provider. Ask your health care provider  what activities are safe for you. °· Rest as much as possible. Try to rest or take a nap when your baby is sleeping. °· Do not lift anything that is heavier than your baby or 10 lb (4.5 kg) until your health care provider says that it is safe. °· Talk with your health care provider about when you can engage in sexual activity. This may depend on your: °? Risk of infection. °? Rate of healing. °? Comfort and desire to engage in sexual activity. °Vaginal Care °· If you have an episiotomy or a vaginal tear, check the area every day for signs of infection. Check for: °? More redness, swelling, or pain. °? More fluid or blood. °? Warmth. °? Pus or a bad smell. °· Do not use tampons or douches until your health care provider says this is safe. °· Watch for any blood clots that may pass from your vagina. These may look like clumps of dark red, brown, or black discharge. °General instructions °· Keep your perineum clean and dry as told by your health care provider. °· Wear loose, comfortable clothing. °· Wipe from front to back when you use the toilet. °· Ask your health care provider if you can shower or take a bath. If you had an episiotomy or a perineal tear during labor and delivery, your health care provider may tell you not to take baths for a certain length of time. °· Wear a bra that supports your breasts and fits you well. °· If possible, have someone help you with household activities and help care for your baby for at least a few days after   you leave the hospital. °· Keep all follow-up visits for you and your baby as told by your health care provider. This is important. °Contact a health care provider if: °· You have: °? Vaginal discharge that has a bad smell. °? Difficulty urinating. °? Pain when urinating. °? A sudden increase or decrease in the frequency of your bowel movements. °? More redness, swelling, or pain around your episiotomy or vaginal tear. °? More fluid or blood coming from your episiotomy or  vaginal tear. °? Pus or a bad smell coming from your episiotomy or vaginal tear. °? A fever. °? A rash. °? Little or no interest in activities you used to enjoy. °? Questions about caring for yourself or your baby. °· Your episiotomy or vaginal tear feels warm to the touch. °· Your episiotomy or vaginal tear is separating or does not appear to be healing. °· Your breasts are painful, hard, or turn red. °· You feel unusually sad or worried. °· You feel nauseous or you vomit. °· You pass large blood clots from your vagina. If you pass a blood clot from your vagina, save it to show to your health care provider. Do not flush blood clots down the toilet without having your health care provider look at them. °· You urinate more than usual. °· You are dizzy or light-headed. °· You have not breastfed at all and you have not had a menstrual period for 12 weeks after delivery. °· You have stopped breastfeeding and you have not had a menstrual period for 12 weeks after you stopped breastfeeding. °Get help right away if: °· You have: °? Pain that does not go away or does not get better with medicine. °? Chest pain. °? Difficulty breathing. °? Blurred vision or spots in your vision. °? Thoughts about hurting yourself or your baby. °· You develop pain in your abdomen or in one of your legs. °· You develop a severe headache. °· You faint. °· You bleed from your vagina so much that you fill two sanitary pads in one hour. °This information is not intended to replace advice given to you by your health care provider. Make sure you discuss any questions you have with your health care provider. °Document Released: 06/07/2000 Document Revised: 11/22/2015 Document Reviewed: 06/25/2015 °Elsevier Interactive Patient Education © 2018 Elsevier Inc. ° °

## 2017-11-26 NOTE — Discharge Summary (Addendum)
OB Discharge Summary     Patient Name: Brenda Porter DOB: 01/26/1986 MRN: [id number]  Date of admission: 11/24/2017 Delivering MD: Frederik PearEGELE, JULIE P   Date of discharge: 11/26/2017  Admitting diagnosis: 39 WEEKS PAIN Intrauterine pregnancy: 5644w5d     Secondary diagnosis:  Active Problems:   History of gestational diabetes mellitus (GDM)   History of shoulder dystocia in prior pregnancy, currently pregnant   Obesity in pregnancy, antepartum   Supervision of high risk pregnancy, antepartum   Tobacco smoking affecting pregnancy, antepartum   Uterine contractions during pregnancy  Additional problems: UDS positive for Cocaine     Discharge diagnosis: Term Pregnancy Delivered                                                                                                Post partum procedures:None  Augmentation: AROM  Complications: None  Hospital course:  Onset of Labor With Vaginal Delivery     32 y.o. yo [id number] at 8044w5d was admitted in Active Labor on 11/24/2017. Patient had an uncomplicated labor course as follows:  Membrane Rupture Time/Date: 11:11 AM ,11/24/2017   Intrapartum Procedures: Episiotomy: None [1]                                         Lacerations:  None [1]  Patient had a delivery of a Viable infant. 11/24/2017  Information for the patient's newborn:  B[place]da [place], Boy Brenda [place] [[id number]][[id number]]  Delivery Method: Vaginal, Spontaneous(Filed from Delivery Summary)    Pateint had an uncomplicated postpartum course.  She is ambulating, tolerating a regular diet, passing flatus, and urinating well. Patient had UDS positive for cocaine and was evaluated by CWS. CPS case pending at time of discharge. Patient is discharged home in stable condition on 11/26/17.   Physical exam  Vitals:   11/25/17 0554 11/25/17 0646 11/25/17 1808 11/26/17 0756  BP:  125/85 138/86 138/87  Pulse:  (!) 52 81 (!) 57  Resp: 18  20 19   Temp: 99.1 F (37.3 C)  98.6 F (37 C) 98.4 F (36.9 C)  TempSrc:  Oral  Oral Oral  SpO2:   98%   Weight:      Height:       General: alert Lochia: appropriate Uterine Fundus: firm Incision: N/A DVT Evaluation: No evidence of DVT seen on physical exam. Labs: Lab Results  Component Value Date   WBC 12.9 (H) 11/24/2017   HGB 11.9 (L) 11/24/2017   HCT 36.2 11/24/2017   MCV 95.3 11/24/2017   PLT 197 11/24/2017   CMP Latest Ref Rng & Units 11/24/2017  Glucose 65 - 99 mg/dL 78  BUN 6 - 20 mg/dL 13  Creatinine 1.300.44 - 8.651.00 mg/dL 7.840.75  Sodium 696135 - 295145 mmol/L 136  Potassium 3.5 - 5.1 mmol/L 4.9  Chloride 101 - 111 mmol/L 106  CO2 22 - 32 mmol/L 21(L)  Calcium 8.9 - 10.3 mg/dL 8.9  Total Protein 6.5 - 8.1 g/dL 6.3(L)  Total Bilirubin 0.3 - 1.2 mg/dL  0.4  Alkaline Phos 38 - 126 U/L 174(H)  AST 15 - 41 U/L 23  ALT 14 - 54 U/L 18    Discharge instruction: per After Visit Summary and "Baby and Me Booklet".  After visit meds:  Allergies as of 11/26/2017      Reactions   Tetanus Toxoids Swelling, Rash   Pt states she received a tetanus toxoid injection in the past and had a severe anaphylactic reaction, severe swelling and rash    Codeine Nausea And Vomiting, Swelling, Rash   Latex Itching, Rash   Penicillins Rash   Has patient had a PCN reaction causing immediate rash, facial/tongue/throat swelling, SOB or lightheadedness with hypotension: No Has patient had a PCN reaction causing severe rash involving mucus membranes or skin necrosis: No Has patient had a PCN reaction that required hospitalization: No Has patient had a PCN reaction occurring within the last 10 years: No If all of the above answers are "NO", then may proceed with Cephalosporin use.      Medication List    TAKE these medications   clotrimazole 1 % cream Commonly known as:  LOTRIMIN Apply topically 2 (two) times daily.   diphenhydramine-acetaminophen 25-500 MG Tabs tablet Commonly known as:  TYLENOL PM Take 1 tablet by mouth at bedtime as needed (For pain and sleep.).    ibuprofen 600 MG tablet Commonly known as:  ADVIL,MOTRIN Take 1 tablet (600 mg total) by mouth every 6 (six) hours.   nystatin powder Commonly known as:  MYCOSTATIN/NYSTOP Apply topically 4 (four) times daily.   prenatal multivitamin Tabs tablet Take 1 tablet by mouth daily at 12 noon.       Diet: routine diet  Activity: Advance as tolerated. Pelvic rest for 6 weeks.   Outpatient follow up:4 Follow up Appt: Future Appointments  Date Time Provider Department Center  01/07/2018  9:15 AM Degele, Kandra Nicolas, MD [facility]   Follow up Visit:No follow-ups on file.  Postpartum contraception: Depo Provera  Newborn Data: Live born female  Birth Weight: 7 lb 9.7 oz (3450 g) APGAR: 8, 9  Newborn Delivery   Birth date/time:  11/24/2017 13:58:00 Delivery type:  Vaginal, Spontaneous     Baby Feeding: Bottle Disposition:CPS status pending   11/26/2017 Brenda Gunner, MD  OB FELLOW DISCHARGE ATTESTATION  I have seen and examined this patient and agree with above documentation in the resident's note.   Frederik Pear, MD OB Fellow

## 2017-11-26 NOTE — Progress Notes (Signed)
CPS case has been assigned to Ebony McNeil/[phone].  CSW left message for CPS worker requesting a call as soon as possible regarding her plan to meet with MOB.  Case was accepted with a 24 hour response time, so worker must make contact today. 
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
## 2017-11-27 ENCOUNTER — Encounter: Payer: Medicaid Other | Admitting: Obstetrics and Gynecology

## 2018-01-07 ENCOUNTER — Ambulatory Visit: Payer: Medicaid Other | Admitting: Family Medicine

## 2018-07-09 IMAGING — US US OB TRANSVAGINAL
1 series · 15 of 28 positions shown · non-contrast
Comparison: 03/25/2017

CLINICAL DATA: First trimester pregnancy with inconclusive fetal
viability.

EXAM:
TRANSVAGINAL OB ULTRASOUND
TECHNIQUE: Transvaginal ultrasound was performed for complete evaluation of the
gestation as well as the maternal uterus, adnexal regions, and
pelvic cul-de-sac.

[Series 1: us ob transvaginal · 15 of 41 slices shown]
[im 1/41]
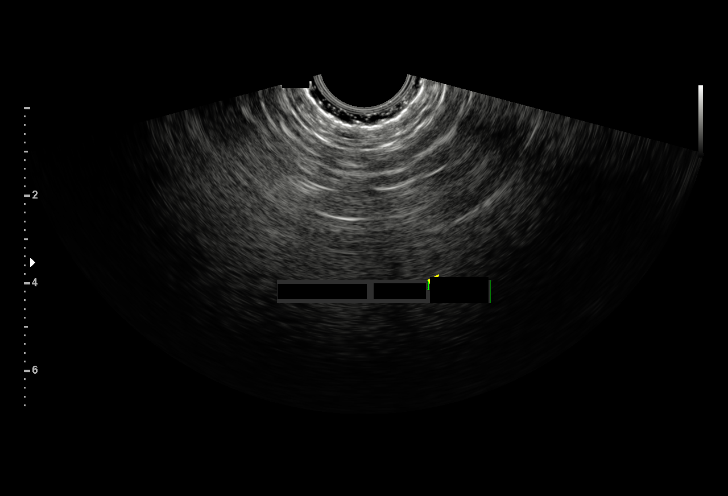
[im 3/41]
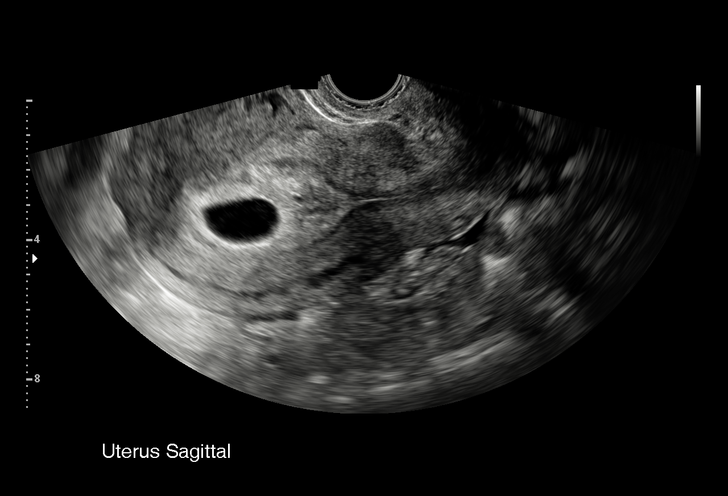
[im 6/41]
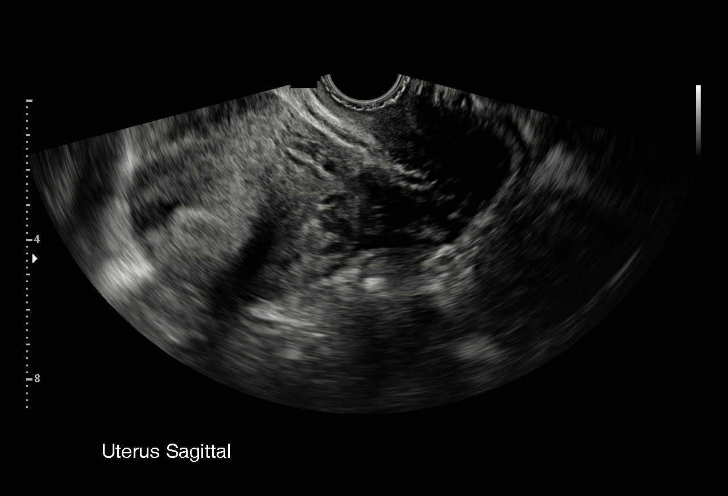
[im 9/41]
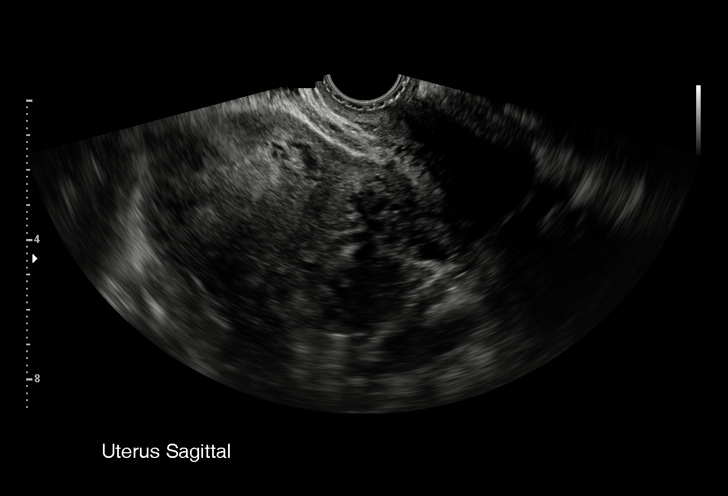
[im 12/41]
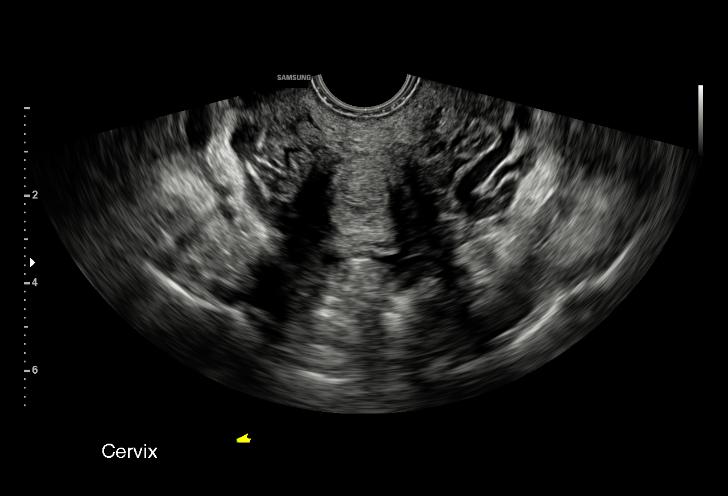
[im 15/41]
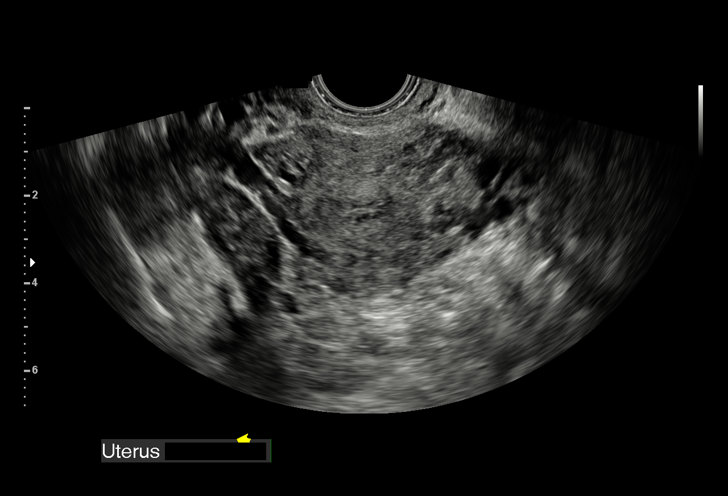
[im 18/41]
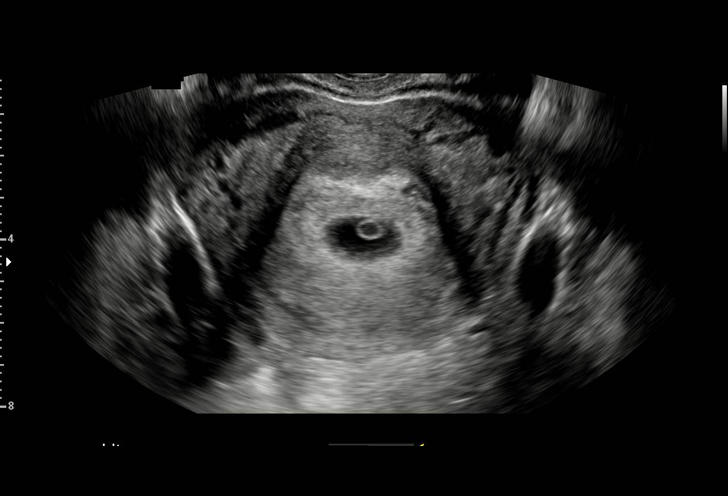
[im 21/41]
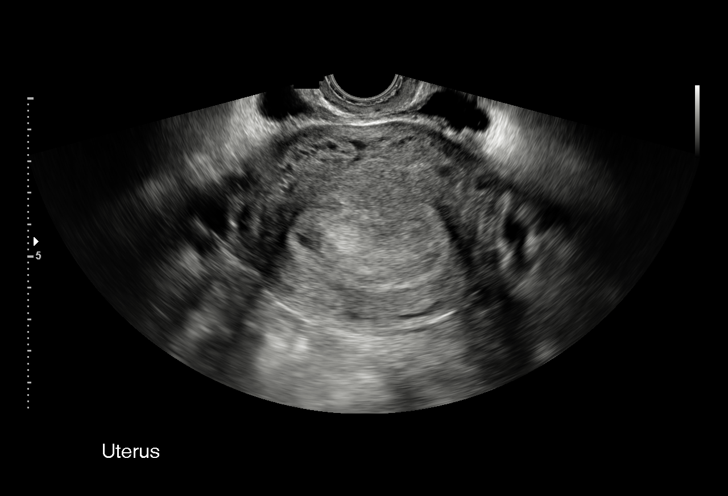
[im 23/41]
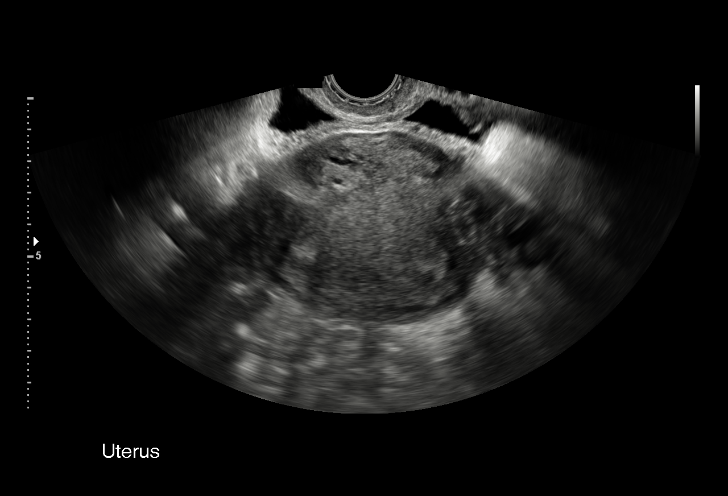
[im 26/41]
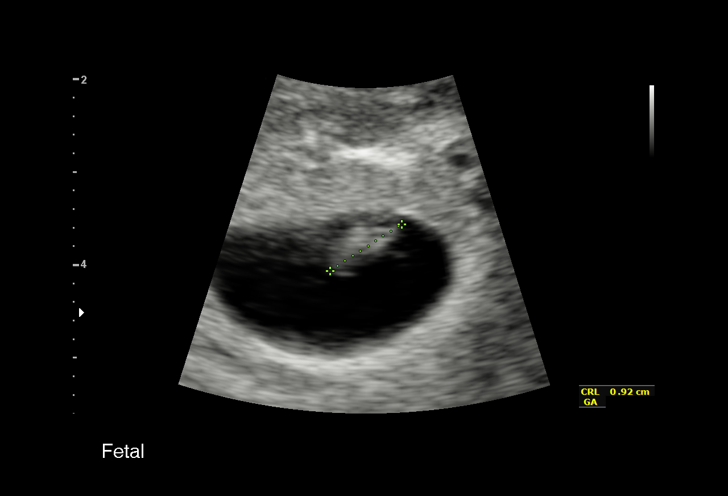
[im 29/41]
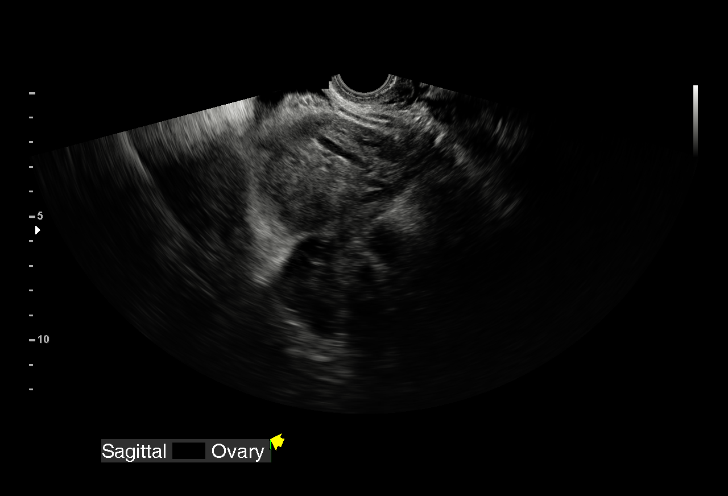
[im 32/41]
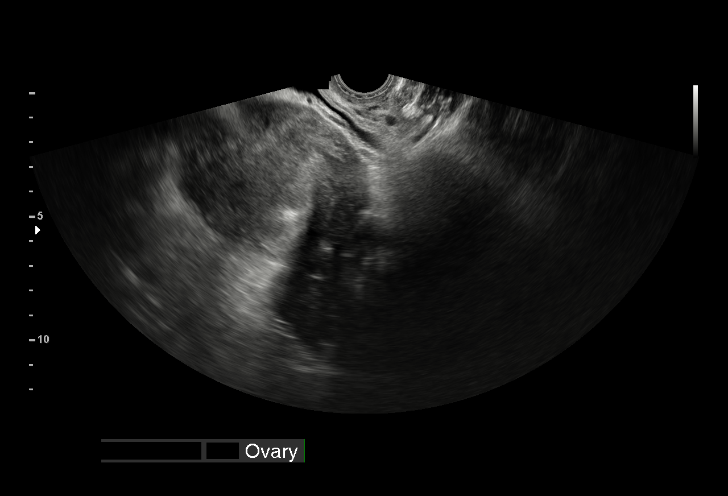
[im 35/41]
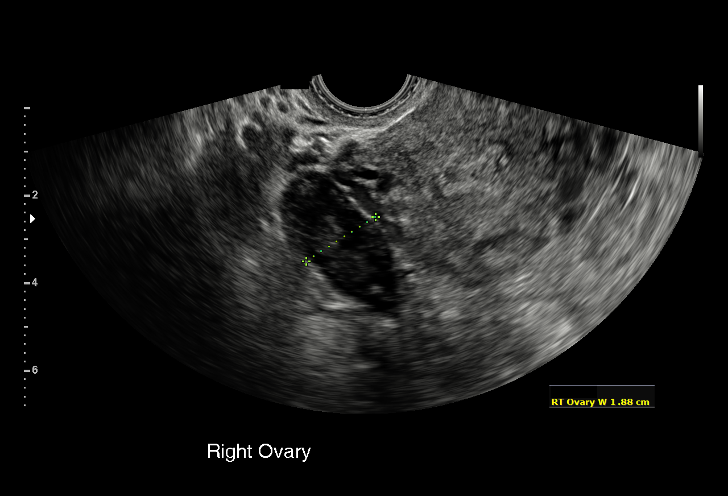
[im 38/41]
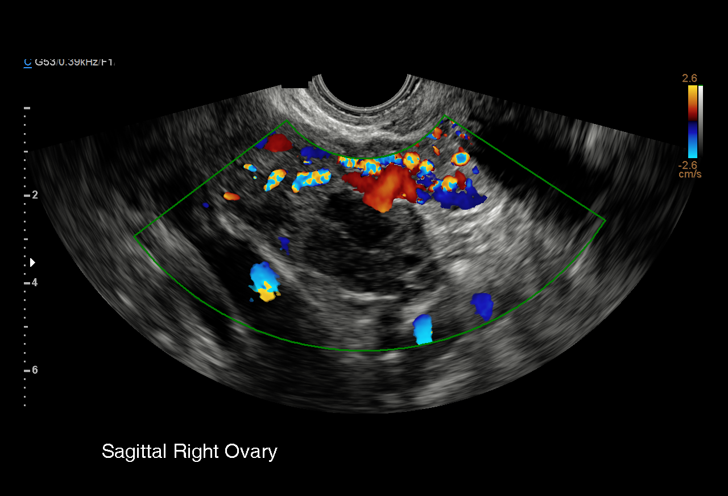
[im 41/41]
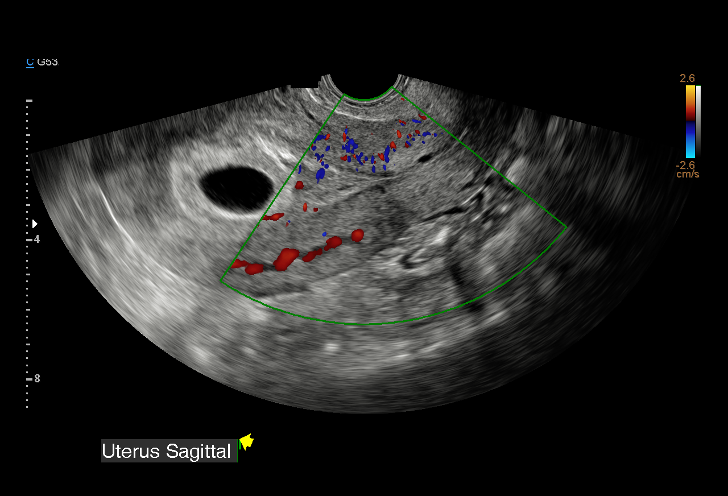

[15 of 28 positions shown; findings below may reference images not displayed]

FINDINGS: Intrauterine gestational sac: Single

Yolk sac:  Visualized.

Embryo:  Visualized.

Cardiac Activity: Visualized.

Heart Rate: 123 bpm

CRL:   9  mm   6 w 6 d                  US EDC: 11/26/2016

Subchorionic hemorrhage:  None visualized.

Maternal uterus/adnexae: Normal appearance of both ovaries. No mass
or free fluid identified.
IMPRESSION: Single living IUP measuring 6 weeks 6 days, with US EDC of
11/26/2016.

No significant maternal uterine or adnexal abnormality identified.

## 2019-11-18 DIAGNOSIS — I493 Ventricular premature depolarization: Secondary | ICD-10-CM

## 2019-11-19 DIAGNOSIS — I493 Ventricular premature depolarization: Secondary | ICD-10-CM | POA: Diagnosis not present

## 2021-12-22 DIAGNOSIS — D62 Acute posthemorrhagic anemia: Secondary | ICD-10-CM | POA: Diagnosis not present
